# Patient Record
Sex: Male | Born: 1965 | Race: Black or African American | Hispanic: No | Marital: Single | Smoking: Never smoker
Health system: Southern US, Community
[De-identification: ages and names within clinical notes are randomized; demographics above are authoritative.]

## PROBLEM LIST (undated history)

## (undated) DIAGNOSIS — H548 Legal blindness, as defined in USA: Secondary | ICD-10-CM

## (undated) DIAGNOSIS — I10 Essential (primary) hypertension: Secondary | ICD-10-CM

## (undated) DIAGNOSIS — S069XAA Unspecified intracranial injury with loss of consciousness status unknown, initial encounter: Secondary | ICD-10-CM

## (undated) HISTORY — DX: Legal blindness, as defined in USA: H54.8

## (undated) HISTORY — PX: HERNIA REPAIR: SHX51

## (undated) HISTORY — DX: Unspecified intracranial injury with loss of consciousness status unknown, initial encounter: S06.9XAA

## (undated) HISTORY — PX: FOOT SURGERY: SHX648

## (undated) HISTORY — PX: WRIST SURGERY: SHX841

## (undated) HISTORY — PX: SHOULDER SURGERY: SHX246

---

## 2016-04-03 ENCOUNTER — Emergency Department
Admission: EM | Admit: 2016-04-03 | Discharge: 2016-04-03 | Disposition: A | Discharge status: Home / Self Care | Payer: Non-veteran care | Attending: Emergency Medicine | Admitting: Emergency Medicine

## 2016-04-03 ENCOUNTER — Encounter: Payer: Self-pay | Admitting: Emergency Medicine

## 2016-04-03 ENCOUNTER — Emergency Department: Payer: Non-veteran care

## 2016-04-03 DIAGNOSIS — K859 Acute pancreatitis without necrosis or infection, unspecified: Secondary | ICD-10-CM

## 2016-04-03 DIAGNOSIS — R778 Other specified abnormalities of plasma proteins: Secondary | ICD-10-CM

## 2016-04-03 DIAGNOSIS — R7989 Other specified abnormal findings of blood chemistry: Secondary | ICD-10-CM

## 2016-04-03 DIAGNOSIS — R1031 Right lower quadrant pain: Secondary | ICD-10-CM

## 2016-04-03 DIAGNOSIS — I10 Essential (primary) hypertension: Secondary | ICD-10-CM | POA: Insufficient documentation

## 2016-04-03 HISTORY — DX: Essential (primary) hypertension: I10

## 2016-04-03 LAB — CBC WITH DIFFERENTIAL/PLATELET
Basophils Absolute: 0 10*3/uL (ref 0–0.1)
Basophils Relative: 0 %
Eosinophils Absolute: 0.2 10*3/uL (ref 0–0.7)
Eosinophils Relative: 4 %
HEMATOCRIT: 39.6 % — AB (ref 40.0–52.0)
Hemoglobin: 14.1 g/dL (ref 13.0–18.0)
LYMPHS PCT: 53 %
Lymphs Abs: 3.2 10*3/uL (ref 1.0–3.6)
MCH: 29 pg (ref 26.0–34.0)
MCHC: 35.6 g/dL (ref 32.0–36.0)
MCV: 81.4 fL (ref 80.0–100.0)
Monocytes Absolute: 0.6 10*3/uL (ref 0.2–1.0)
Monocytes Relative: 10 %
NEUTROS ABS: 2 10*3/uL (ref 1.4–6.5)
NEUTROS PCT: 33 %
Platelets: 224 10*3/uL (ref 150–440)
RBC: 4.86 MIL/uL (ref 4.40–5.90)
RDW: 13.9 % (ref 11.5–14.5)
WBC: 6 10*3/uL (ref 3.8–10.6)

## 2016-04-03 LAB — BASIC METABOLIC PANEL
ANION GAP: 5 (ref 5–15)
BUN: 12 mg/dL (ref 6–20)
CALCIUM: 8.9 mg/dL (ref 8.9–10.3)
CO2: 28 mmol/L (ref 22–32)
Chloride: 105 mmol/L (ref 101–111)
Creatinine, Ser: 1.17 mg/dL (ref 0.61–1.24)
Glucose, Bld: 96 mg/dL (ref 65–99)
Potassium: 4 mmol/L (ref 3.5–5.1)
Sodium: 138 mmol/L (ref 135–145)

## 2016-04-03 LAB — URINALYSIS, COMPLETE (UACMP) WITH MICROSCOPIC
BACTERIA UA: NONE SEEN
BILIRUBIN URINE: NEGATIVE
Glucose, UA: NEGATIVE mg/dL
Hgb urine dipstick: NEGATIVE
KETONES UR: NEGATIVE mg/dL
LEUKOCYTES UA: NEGATIVE
NITRITE: NEGATIVE
Protein, ur: NEGATIVE mg/dL
RBC / HPF: NONE SEEN RBC/hpf (ref 0–5)
SPECIFIC GRAVITY, URINE: 1.008 (ref 1.005–1.030)
SQUAMOUS EPITHELIAL / LPF: NONE SEEN
WBC, UA: NONE SEEN WBC/hpf (ref 0–5)
pH: 6 (ref 5.0–8.0)

## 2016-04-03 LAB — HEPATIC FUNCTION PANEL
ALBUMIN: 4 g/dL (ref 3.5–5.0)
ALT: 55 U/L (ref 17–63)
AST: 72 U/L — AB (ref 15–41)
Alkaline Phosphatase: 44 U/L (ref 38–126)
Bilirubin, Direct: <0.1 mg/dL — ABNORMAL LOW (ref 0.1–0.5)
TOTAL PROTEIN: 7.2 g/dL (ref 6.5–8.1)
Total Bilirubin: 0.7 mg/dL (ref 0.3–1.2)

## 2016-04-03 LAB — LIPASE, BLOOD: Lipase: 101 U/L — ABNORMAL HIGH (ref 11–51)

## 2016-04-03 LAB — TROPONIN I
TROPONIN I: 0.07 ng/mL — AB (ref <0.03)
Troponin I: 0.07 ng/mL (ref <0.03)

## 2016-04-03 LAB — LACTIC ACID, PLASMA: LACTIC ACID, VENOUS: 0.8 mmol/L (ref 0.5–1.9)

## 2016-04-03 LAB — C-REACTIVE PROTEIN

## 2016-04-03 MED ORDER — DEXTROSE 5 % IV SOLN: 1.0000 g | Freq: Once | INTRAVENOUS | Status: DC

## 2016-04-03 MED ORDER — OXYCODONE-ACETAMINOPHEN 5-325 MG PO TABS
2.0000 | ORAL_TABLET | Freq: Once | ORAL | Status: AC
  Administered 2016-04-03: 2 via ORAL
  Filled 2016-04-03: qty 2

## 2016-04-03 MED ORDER — ONDANSETRON HCL 4 MG/2ML IJ SOLN
4.0000 mg | Freq: Once | INTRAMUSCULAR | Status: AC
  Administered 2016-04-03: 4 mg via INTRAVENOUS
  Filled 2016-04-03: qty 2

## 2016-04-03 MED ORDER — SODIUM CHLORIDE 0.9 % IV BOLUS (SEPSIS)
1000.0000 mL | Freq: Once | INTRAVENOUS | Status: AC
  Administered 2016-04-03: 1000 mL via INTRAVENOUS

## 2016-04-03 MED ORDER — METRONIDAZOLE IN NACL 5-0.79 MG/ML-% IV SOLN
500.0000 mg | Freq: Once | INTRAVENOUS | Status: AC
  Administered 2016-04-03: 500 mg via INTRAVENOUS
  Filled 2016-04-03: qty 100

## 2016-04-03 MED ORDER — KETOROLAC TROMETHAMINE 30 MG/ML IJ SOLN
15.0000 mg | Freq: Once | INTRAMUSCULAR | Status: AC
  Administered 2016-04-03: 15 mg via INTRAVENOUS
  Filled 2016-04-03: qty 1

## 2016-04-03 MED ORDER — IOPAMIDOL (ISOVUE-300) INJECTION 61%
125.0000 mL | Freq: Once | INTRAVENOUS | Status: AC | PRN
  Administered 2016-04-03: 125 mL via INTRAVENOUS

## 2016-04-03 MED ORDER — OXYCODONE-ACETAMINOPHEN 5-325 MG PO TABS: 1.0000 | ORAL_TABLET | Freq: Four times a day (QID) | ORAL | 0 refills | Status: DC | PRN

## 2016-04-03 MED ORDER — MORPHINE SULFATE (PF) 4 MG/ML IV SOLN
4.0000 mg | Freq: Once | INTRAVENOUS | Status: AC
Start: 2016-04-03 — End: 2016-04-03
  Administered 2016-04-03: 4 mg via INTRAVENOUS
  Filled 2016-04-03: qty 1

## 2016-04-03 MED ORDER — MORPHINE SULFATE (PF) 4 MG/ML IV SOLN
8.0000 mg | Freq: Once | INTRAVENOUS | Status: AC
  Administered 2016-04-03: 8 mg via INTRAVENOUS
  Filled 2016-04-03: qty 2

## 2016-04-03 MED ORDER — IOPAMIDOL (ISOVUE-300) INJECTION 61%
30.0000 mL | Freq: Once | INTRAVENOUS | Status: AC
  Administered 2016-04-03: 30 mL via ORAL

## 2016-04-03 MED ORDER — CEFTRIAXONE SODIUM-DEXTROSE 1-3.74 GM-% IV SOLR
1.0000 g | Freq: Once | INTRAVENOUS | Status: AC
  Administered 2016-04-03: 1 g via INTRAVENOUS
  Filled 2016-04-03: qty 50

## 2016-04-03 NOTE — Discharge Instructions (Addendum)
Please follow-up with your primary care physician on Monday for recheck. Return to the emergency department sooner for any new or worsening symptoms such as if you cannot eat or drink, for fevers or chills, or for any other concerns.

## 2016-04-03 NOTE — ED Triage Notes (Signed)
Pt presents to ED 15 via EMS c/o abdominal pain in RLQ x4 days; per EMS pt's vitals were BP 169/92, HR 76, RR 20; pt has history of HTN and takes Lisnopril and metaprolol. Pt states allergy to Iodine.

## 2016-04-03 NOTE — ED Provider Notes (Signed)
Hand off from Dr.Rifenbark in this 51 year old male with right lower quadrant pain and nausea over the past week. CAT scan was not concerning for appendicitis. However, the patient did have a slightly elevated troponin at 0.07. The plan is to recheck a second troponin and reevaluated and likely discharge if the troponin is stable or decreasing.  Physical Exam  BP 136/83 (BP Location: Right Arm)   Pulse 76   Temp 97.8 F (36.6 C) (Oral)   Resp 18   Ht 6\' 6"  (1.981 m)   Wt 300 lb (136.1 kg)   SpO2 96%   BMI 34.67 kg/m  ----------------------------------------- 9:53 AM on 04/03/2016 -----------------------------------------   Physical Exam Patient without any acute distress. Palpable the abdomen and there is still mild to moderate tenderness across the lower abdomen without any rebound or guarding. ED Course  Procedures  MDM Patient denies any chest pain or shortness of breath. I discussed the case with surgeon, Dr. Aleen CampiPiscoya, who reviewed the CAT scan and agrees with radiology read. The patient was also found to have a fairly large amount of stool throughout his colon. I discussed with the patient and he says that he has had issues with constipation the past. He says that he would like to try over-the-counter laxatives at home such as MiraLAX, Colace or magnesium citrate. He was also found to have a slightly elevated lipase at 100 but he is able to tolerate by mouth. We discussed using a simple diet treated pancreatitis. The patient denies any excessive alcohol intake. His troponin was also found to be unchanged at 0.07 after the second draw. He will be discharged home at this time. [facility] He will also be given cardiology follow-up.       Myrna Blazeravid Matthew Schaevitz, MD 04/03/16 [phone]

## 2016-04-03 NOTE — ED Provider Notes (Signed)
[facility] Emergency Department Provider Note  ____________________________________________   First MD Initiated Contact with Patient 04/03/16 [phone]     (approximate)  I have reviewed the triage vital signs and the nursing notes.   HISTORY  Chief Complaint Abdominal Pain   HPI Juan Ramos is a 51 y.o. male who comes to the emergency department with severe aching cramping right lower quadrant pain for the past 3 days. Pain initially began more diffuse but has progressively worsened and become right lower quadrant. He does have a history of right inguinal hernia repair. He denies fevers or chills. He denies chest pain or shortness of breath. He reports nausea but denies vomiting. He denies diarrhea. He is not constipated. Is passing normal flatus. He feels achy cannot find a position of comfort.Nothing makes the pain better or worse. Pain does not radiate.   Past Medical History:  Diagnosis Date  . Hypertension     There are no active problems to display for this patient.   Past Surgical History:  Procedure Laterality Date  . HERNIA REPAIR    . SHOULDER SURGERY      Prior to Admission medications   Not on File    Allergies Shellfish allergy  History reviewed. No pertinent family history.  Social History Social History  Substance Use Topics  . Smoking status: Never Smoker  . Smokeless tobacco: Never Used  . Alcohol use No    Review of Systems Constitutional: No fever/chills Eyes: No visual changes. ENT: No sore throat. Cardiovascular: Denies chest pain. Respiratory: Denies shortness of breath. Gastrointestinal: Positive abdominal pain.  Positive nausea, no vomiting.  No diarrhea.  No constipation. Genitourinary: Negative for dysuria. Musculoskeletal: Negative for back pain. Skin: Negative for rash. Neurological: Negative for headaches, focal weakness or numbness.  10-point ROS otherwise  negative.  ____________________________________________   PHYSICAL EXAM:  VITAL SIGNS: ED Triage Vitals  Enc Vitals Group     BP      Pulse      Resp      Temp      Temp src      SpO2      Weight      Height      Head Circumference      Peak Flow      Pain Score      Pain Loc      Pain Edu?      Excl. in GC?     Constitutional: Alert and oriented x 4 Appears extremely uncomfortable moaning in bed Eyes: PERRL EOMI. Head: Atraumatic. Nose: No congestion/rhinnorhea. Mouth/Throat: No trismus Neck: No stridor.   Cardiovascular: Normal rate, regular rhythm. Grossly normal heart sounds.  Good peripheral circulation. Respiratory: Normal respiratory effort.  No retractions. Lungs CTAB and moving good air Gastrointestinal: Soft nondistended very tender over right lower quadrant over McBurney's point with a positive Rovsing's voluntary guarding no rebound no frank peritonitis Normal hernia exam on the right and left Musculoskeletal: No lower extremity edema   Neurologic:  Normal speech and language. No gross focal neurologic deficits are appreciated. Skin:  Skin is warm, dry and intact. No rash noted. Psychiatric: Mood and affect are normal. Speech and behavior are normal.    ____________________________________________   DIFFERENTIAL  Appendicitis, diverticulitis, enteritis, inguinal hernia, lower lobe pneumonia ____________________________________________   LABS (all labs ordered are listed, but only abnormal results are displayed)  Labs Reviewed  HEPATIC FUNCTION PANEL - Abnormal; Notable for the following:  Result Value   AST 72 (*)    Bilirubin, Direct <0.1 (*)    All other components within normal limits  TROPONIN I - Abnormal; Notable for the following:    Troponin I 0.07 (*)    All other components within normal limits  CBC WITH DIFFERENTIAL/PLATELET - Abnormal; Notable for the following:    HCT 39.6 (*)    All other components within normal limits   URINALYSIS, COMPLETE (UACMP) WITH MICROSCOPIC - Abnormal; Notable for the following:    Color, Urine STRAW (*)    APPearance CLEAR (*)    All other components within normal limits  CULTURE, BLOOD (ROUTINE X 2)  CULTURE, BLOOD (ROUTINE X 2)  BASIC METABOLIC PANEL  LACTIC ACID, PLASMA  C-REACTIVE PROTEIN  TROPONIN I    Slightly elevated troponin __________________________________________  EKG   ____________________________________________  RADIOLOGY  CT scan is negative for acute pathology ____________________________________________   PROCEDURES  Procedure(s) performed: no  Procedures  Critical Care performed: no  ____________________________________________   INITIAL IMPRESSION / ASSESSMENT AND PLAN / ED COURSE  Pertinent labs & imaging results that were available during my care of the patient were reviewed by me and considered in my medical decision making (see chart for details).  On arrival the patient is uncomfortable appearing with right lower quadrant tenderness and a positive Rovsing's which are consistent with appendicitis. He has no palpable hernia. Nothing by mouth fluids ceftriaxone and Flagyl pending CT scan.      ----------------------------------------- 7:30 AM on 04/03/2016 -----------------------------------------  The patient's CT scan is negative for appendicitis and negative for acute pathology. His hernia is well reduced. No clear etiology of his symptoms. The only lab abnormalities a slight elevation in his troponin which may be secondary to pain and manage ischemia. I'll check a repeat now but if it is normal the patient will be stable for discharge home. ____________________________________________   FINAL CLINICAL IMPRESSION(S) / ED DIAGNOSES  Final diagnoses:  Elevated troponin  Right lower quadrant abdominal pain      NEW MEDICATIONS STARTED DURING THIS VISIT:  New Prescriptions   No medications on file     Note:  This  document was prepared using Dragon voice recognition software and may include unintentional dictation errors.     Merrily Brittle, MD 04/03/16 [phone]

## 2016-04-05 ENCOUNTER — Telehealth: Payer: Self-pay

## 2016-04-05 NOTE — Telephone Encounter (Signed)
Lmov for patient to call back  °He was seen in ED fu for Abdominal pain  on 04/03/16   °Will try again at a later time.  °

## 2016-04-06 NOTE — Telephone Encounter (Signed)
Lmov for patient to call back  He was seen in ED fu for Abdominal pain  on 04/03/16  Will try again at a later time.

## 2016-04-08 LAB — CULTURE, BLOOD (ROUTINE X 2)
CULTURE: NO GROWTH
Culture: NO GROWTH

## 2016-04-12 NOTE — Telephone Encounter (Signed)
Sent letter

## 2016-04-28 ENCOUNTER — Ambulatory Visit: Payer: No Typology Code available for payment source | Admitting: Internal Medicine

## 2016-05-04 ENCOUNTER — Ambulatory Visit: Payer: No Typology Code available for payment source | Admitting: Internal Medicine

## 2017-03-24 DIAGNOSIS — M5431 Sciatica, right side: Secondary | ICD-10-CM | POA: Insufficient documentation

## 2017-03-24 DIAGNOSIS — IMO0002 Reserved for concepts with insufficient information to code with codable children: Secondary | ICD-10-CM | POA: Insufficient documentation

## 2017-03-24 DIAGNOSIS — G43709 Chronic migraine without aura, not intractable, without status migrainosus: Secondary | ICD-10-CM | POA: Insufficient documentation

## 2017-04-06 DIAGNOSIS — M25512 Pain in left shoulder: Secondary | ICD-10-CM | POA: Insufficient documentation

## 2017-04-06 DIAGNOSIS — G8929 Other chronic pain: Secondary | ICD-10-CM | POA: Insufficient documentation

## 2017-10-13 DIAGNOSIS — G8929 Other chronic pain: Secondary | ICD-10-CM | POA: Insufficient documentation

## 2017-12-10 ENCOUNTER — Emergency Department
Admission: EM | Admit: 2017-12-10 | Discharge: 2017-12-10 | Disposition: A | Discharge status: Home / Self Care | Payer: Non-veteran care | Attending: Emergency Medicine | Admitting: Emergency Medicine

## 2017-12-10 ENCOUNTER — Other Ambulatory Visit: Payer: Self-pay

## 2017-12-10 ENCOUNTER — Emergency Department: Payer: Non-veteran care

## 2017-12-10 DIAGNOSIS — I10 Essential (primary) hypertension: Secondary | ICD-10-CM | POA: Insufficient documentation

## 2017-12-10 DIAGNOSIS — G43809 Other migraine, not intractable, without status migrainosus: Secondary | ICD-10-CM | POA: Diagnosis not present

## 2017-12-10 DIAGNOSIS — R079 Chest pain, unspecified: Secondary | ICD-10-CM | POA: Insufficient documentation

## 2017-12-10 DIAGNOSIS — Z79899 Other long term (current) drug therapy: Secondary | ICD-10-CM | POA: Diagnosis not present

## 2017-12-10 LAB — URINE DRUG SCREEN, QUALITATIVE (ARMC ONLY)
AMPHETAMINES, UR SCREEN: NOT DETECTED
Barbiturates, Ur Screen: NOT DETECTED
Benzodiazepine, Ur Scrn: NOT DETECTED
Cannabinoid 50 Ng, Ur ~~LOC~~: NOT DETECTED
Cocaine Metabolite,Ur ~~LOC~~: NOT DETECTED
MDMA (ECSTASY) UR SCREEN: NOT DETECTED
Methadone Scn, Ur: NOT DETECTED
Opiate, Ur Screen: POSITIVE — AB
Phencyclidine (PCP) Ur S: NOT DETECTED
Tricyclic, Ur Screen: NOT DETECTED

## 2017-12-10 LAB — CBC WITH DIFFERENTIAL/PLATELET
Abs Immature Granulocytes: 0.01 10*3/uL (ref 0.00–0.07)
BASOS PCT: 0 %
Basophils Absolute: 0 10*3/uL (ref 0.0–0.1)
EOS PCT: 3 %
Eosinophils Absolute: 0.1 10*3/uL (ref 0.0–0.5)
HCT: 42 % (ref 39.0–52.0)
HEMOGLOBIN: 14.8 g/dL (ref 13.0–17.0)
Immature Granulocytes: 0 %
Lymphocytes Relative: 52 %
Lymphs Abs: 2.5 10*3/uL (ref 0.7–4.0)
MCH: 28.4 pg (ref 26.0–34.0)
MCHC: 35.2 g/dL (ref 30.0–36.0)
MCV: 80.5 fL (ref 80.0–100.0)
MONO ABS: 0.4 10*3/uL (ref 0.1–1.0)
MONOS PCT: 8 %
Neutro Abs: 1.8 10*3/uL (ref 1.7–7.7)
Neutrophils Relative %: 37 %
Platelets: 215 10*3/uL (ref 150–400)
RBC: 5.22 MIL/uL (ref 4.22–5.81)
RDW: 13.7 % (ref 11.5–15.5)
WBC: 4.8 10*3/uL (ref 4.0–10.5)
nRBC: 0 % (ref 0.0–0.2)

## 2017-12-10 LAB — COMPREHENSIVE METABOLIC PANEL
ALBUMIN: 4 g/dL (ref 3.5–5.0)
ALK PHOS: 38 U/L (ref 38–126)
ALT: 21 U/L (ref 0–44)
ANION GAP: 6 (ref 5–15)
AST: 26 U/L (ref 15–41)
BUN: 12 mg/dL (ref 6–20)
CO2: 28 mmol/L (ref 22–32)
Calcium: 8.7 mg/dL — ABNORMAL LOW (ref 8.9–10.3)
Chloride: 106 mmol/L (ref 98–111)
Creatinine, Ser: 1.26 mg/dL — ABNORMAL HIGH (ref 0.61–1.24)
GFR calc Af Amer: >60 mL/min (ref >60)
GFR calc non Af Amer: >60 mL/min (ref >60)
GLUCOSE: 97 mg/dL (ref 70–99)
POTASSIUM: 3.8 mmol/L (ref 3.5–5.1)
Sodium: 140 mmol/L (ref 135–145)
Total Bilirubin: 0.5 mg/dL (ref 0.3–1.2)
Total Protein: 7.3 g/dL (ref 6.5–8.1)

## 2017-12-10 LAB — TROPONIN I
TROPONIN I: 0.07 ng/mL — AB (ref <0.03)
TROPONIN I: 0.06 ng/mL — AB (ref <0.03)

## 2017-12-10 LAB — ETHANOL: Alcohol, Ethyl (B): <10 mg/dL (ref <10)

## 2017-12-10 LAB — LIPASE, BLOOD: Lipase: 41 U/L (ref 11–51)

## 2017-12-10 MED ORDER — DIPHENHYDRAMINE HCL 50 MG/ML IJ SOLN
25.0000 mg | Freq: Once | INTRAMUSCULAR | Status: AC
  Administered 2017-12-10: 25 mg via INTRAVENOUS
  Filled 2017-12-10: qty 1

## 2017-12-10 MED ORDER — PROCHLORPERAZINE EDISYLATE 10 MG/2ML IJ SOLN
5.0000 mg | Freq: Once | INTRAMUSCULAR | Status: AC
  Administered 2017-12-10: 5 mg via INTRAVENOUS
  Filled 2017-12-10: qty 2

## 2017-12-10 MED ORDER — DEXTROSE-NACL 5-0.45 % IV SOLN
INTRAVENOUS | Status: DC
  Administered 2017-12-10: 09:00:00 via INTRAVENOUS

## 2017-12-10 MED ORDER — ONDANSETRON HCL 4 MG/2ML IJ SOLN
4.0000 mg | Freq: Once | INTRAMUSCULAR | Status: AC
  Administered 2017-12-10: 4 mg via INTRAVENOUS
  Filled 2017-12-10: qty 2

## 2017-12-10 MED ORDER — MORPHINE SULFATE (PF) 4 MG/ML IV SOLN
4.0000 mg | Freq: Once | INTRAVENOUS | Status: AC
  Administered 2017-12-10: 4 mg via INTRAVENOUS
  Filled 2017-12-10: qty 1

## 2017-12-10 MED ORDER — FAMOTIDINE IN NACL 20-0.9 MG/50ML-% IV SOLN
20.0000 mg | Freq: Once | INTRAVENOUS | Status: AC
  Administered 2017-12-10: 20 mg via INTRAVENOUS
  Filled 2017-12-10: qty 50

## 2017-12-10 NOTE — Discharge Instructions (Signed)
Continue all medicines as directed by your doctor. Return to the ER for worsening symptoms, persistent vomiting, difficulty breathing or other concerns. °

## 2017-12-10 NOTE — ED Provider Notes (Signed)
[facility] Emergency Department Provider Note   ____________________________________________   First MD Initiated Contact with Patient 12/10/17 0406     (approximate)  I have reviewed the triage vital signs and the nursing notes.   HISTORY  Chief Complaint Chest pain   HPI Juan Ramos is a 52 y.o. male brought to the ED from home via EMS with a chief complaint of chest pain.  Patient states was still awake when he noted onset of left-sided chest pressure approximately 3 hours ago.  Symptoms associated with diaphoresis, shortness of breath and nausea.  Received full-strength aspirin and 3 sublingual nitroglycerin by EMS which brought his pain from 8/10 down to 4/10.  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  Denies associated fever, chills, cough, congestion, abdominal pain, vomiting, diarrhea.  Denies recent travel or trauma.   Past Medical History:  Diagnosis Date  . Hypertension     There are no active problems to display for this patient.   Past Surgical History:  Procedure Laterality Date  . HERNIA REPAIR    . SHOULDER SURGERY      Prior to Admission medications   Medication Sig Start Date End Date Taking? Authorizing Provider  oxybutynin (DITROPAN-XL) 10 MG 24 hr tablet Take by mouth. 11/15/17  Yes [provider]  Aspirin-Calcium Carbonate 81-777 MG TABS Take by mouth.    [provider]  atenolol (TENORMIN) 25 MG tablet Take by mouth.    [provider]  diclofenac sodium (VOLTAREN) 1 % GEL Place onto the skin.    [provider]  dicyclomine (BENTYL) 10 MG capsule Take by mouth.    [provider]  DULoxetine (CYMBALTA) 30 MG capsule Take by mouth.    [provider]  loratadine (CLARITIN) 10 MG tablet Take by mouth.    [provider]  naproxen (NAPROSYN) 500 MG tablet Take by mouth.    [provider]  oxyCODONE-acetaminophen (ROXICET)  5-325 MG tablet Take 1 tablet by mouth every 6 (six) hours as needed. 04/03/16   Merrily Brittleifenbark, Neil, MD  pantoprazole (PROTONIX) 40 MG tablet Take by mouth.    [provider]  polyethylene glycol (MIRALAX / GLYCOLAX) packet Take by mouth.    [provider]  sildenafil (VIAGRA) 100 MG tablet Take by mouth.    [provider]  SUMAtriptan (IMITREX) 100 MG tablet Take by mouth.    [provider]  SUMAtriptan 6 MG/0.5ML SOAJ Inject into the skin.    [provider]  topiramate (TOPAMAX) 25 MG capsule Take by mouth.    [provider]  venlafaxine (EFFEXOR) 75 MG tablet Take by mouth.    [provider]    Allergies Shellfish allergy  History reviewed. No pertinent family history.  Social History Social History   Tobacco Use  . Smoking status: Never Smoker  . Smokeless tobacco: Never Used  Substance Use Topics  . Alcohol use: No  . Drug use: No    Review of Systems  Constitutional: No fever/chills Eyes: No visual changes. ENT: No sore throat. Cardiovascular: Positive for chest pain. Respiratory: Denies shortness of breath. Gastrointestinal: No abdominal pain.  Positive for nausea, no vomiting.  No diarrhea.  No constipation. Genitourinary: Negative for dysuria. Musculoskeletal: Negative for back pain. Skin: Negative for rash. Neurological: Negative for headaches, focal weakness or numbness.   ____________________________________________   PHYSICAL EXAM:  VITAL SIGNS: ED Triage Vitals  Enc Vitals Group     BP  Pulse      Resp      Temp      Temp src      SpO2      Weight      Height      Head Circumference      Peak Flow      Pain Score      Pain Loc      Pain Edu?      Excl. in GC?     Constitutional: Alert and oriented. Well appearing and in mild acute distress. Eyes: Conjunctivae are normal. PERRL. EOMI. Wearing sunglasses. Head: Atraumatic. Nose: No congestion/rhinnorhea. Mouth/Throat:  Mucous membranes are moist.  Oropharynx non-erythematous. Neck: No stridor.   Cardiovascular: Normal rate, regular rhythm. Grossly normal heart sounds.  Good peripheral circulation. Respiratory: Normal respiratory effort.  No retractions. Lungs CTAB. Gastrointestinal: Soft and nontender. No distention. No abdominal bruits. No CVA tenderness. Musculoskeletal: No lower extremity tenderness nor edema.  No joint effusions. Neurologic:  Normal speech and language. No gross focal neurologic deficits are appreciated.  Skin:  Skin is warm, dry and intact. No rash noted. Psychiatric: Mood and affect are normal. Speech and behavior are normal.  ____________________________________________   LABS (all labs ordered are listed, but only abnormal results are displayed)  Labs Reviewed  COMPREHENSIVE METABOLIC PANEL - Abnormal; Notable for the following components:      Result Value   Creatinine, Ser 1.26 (*)    Calcium 8.7 (*)    All other components within normal limits  TROPONIN I - Abnormal; Notable for the following components:   Troponin I 0.07 (*)    All other components within normal limits  CBC WITH DIFFERENTIAL/PLATELET  ETHANOL  LIPASE, BLOOD  URINE DRUG SCREEN, QUALITATIVE ([facility] ONLY)  TROPONIN I   ____________________________________________  EKG  ED ECG REPORT I, Paz Winsett J, the attending physician, personally viewed and interpreted this ECG.   Date: 12/10/2017  EKG Time: 0411  Rate: 63  Rhythm: normal EKG, normal sinus rhythm  Axis: Normal  Intervals:none  ST&T Change: Nonspecific  ____________________________________________  RADIOLOGY  ED MD interpretation: No acute cardiopulmonary process  Official radiology report(s): Dg Chest Port 1 View  Result Date: 12/10/2017 CLINICAL DATA:  Chest pain EXAM: PORTABLE CHEST 1 VIEW COMPARISON:  None. FINDINGS: The heart size and mediastinal contours are within normal limits. Both lungs are clear. The visualized skeletal  structures are unremarkable. IMPRESSION: No active disease. Electronically Signed   By: Deatra Robinson M.D.   On: 12/10/2017 05:05    ____________________________________________   PROCEDURES  Procedure(s) performed: None  Procedures  Critical Care performed: None  ____________________________________________   INITIAL IMPRESSION / ASSESSMENT AND PLAN / ED COURSE  As part of my medical decision making, I reviewed the following data within the electronic MEDICAL RECORD NUMBER Nursing notes reviewed and incorporated, Labs reviewed, EKG interpreted, Old chart reviewed, Radiograph reviewed, Discussed with admitting physician and Notes from prior ED visits   53 year old male with a history of hypertension who presents with chest pain. Differential diagnosis includes, but is not limited to, ACS, aortic dissection, pulmonary embolism, cardiac tamponade, pneumothorax, pneumonia, pericarditis, myocarditis, GI-related causes including esophagitis/gastritis, and musculoskeletal chest wall pain.    Will check cardiac panel, administer 4 mg IV morphine for pain paired with 4 mg IV Zofran for nausea.  Will reassess.  Clinical Course as of Dec 11 718  Sat Dec 10, 2017  9147 Updated patient of all lab results.  Looks like he has chronically  elevated troponins.  He was seen at an outside hospital in September for similar chest pain.  States he had a follow-up with a cardiologist at the [facility] with normal stress test.  Given this information, will repeat timed troponin, add 20mg  IV Pepcid for discomfort. At this time patient is more concerned with his recurrent migraine headache than his chest pain. Will administer IV fluids, 5mg  IV Compazine paired with 25mg  IV Benadryl.   [JS]  0702 Patient sleeping in no acute distress.  Repeat troponin to be drawn shortly.  If it remains stable, anticipate patient may be discharged home.  At this time care is transferred to Dr. Roxan Hockey pending repeat troponin  and disposition.   [JS]    Clinical Course User Index [JS] Irean Hong, MD     ____________________________________________   FINAL CLINICAL IMPRESSION(S) / ED DIAGNOSES  Final diagnoses:  Chest pain, unspecified type  Other migraine without status migrainosus, not intractable     ED Discharge Orders    None       Note:  This document was prepared using Dragon voice recognition software and may include unintentional dictation errors.   Irean Hong, MD 12/10/17 [phone]

## 2017-12-10 NOTE — ED Provider Notes (Signed)
Patient received in sign-out from Dr. Dolores FrameSung.  Workup and evaluation pending repeat troponin.  Troponin is downtrending.  This point patient appears stable and appropriate for discharge home.Juan Ramos.      Juan Calderone, MD 12/10/17 [phone]

## 2017-12-10 NOTE — ED Triage Notes (Signed)
Pt arrived via [facility] from home with c/o Chest pain. EMS states pt started having chest pain while he was sitting at home. EMS states pt was given 324 mg aspirin and 3 nitro sprays. Pt states pain went from 8/10 to 4/10.

## 2017-12-10 NOTE — ED Notes (Signed)
Lab called to report a critical lab value: Troponin 0.07

## 2017-12-22 DIAGNOSIS — R3915 Urgency of urination: Secondary | ICD-10-CM | POA: Insufficient documentation

## 2017-12-22 DIAGNOSIS — N138 Other obstructive and reflux uropathy: Secondary | ICD-10-CM | POA: Insufficient documentation

## 2017-12-22 DIAGNOSIS — N401 Enlarged prostate with lower urinary tract symptoms: Secondary | ICD-10-CM | POA: Insufficient documentation

## 2018-07-13 ENCOUNTER — Ambulatory Visit: Payer: No Typology Code available for payment source | Admitting: Licensed Clinical Social Worker

## 2018-07-13 ENCOUNTER — Other Ambulatory Visit: Payer: Self-pay

## 2018-07-27 ENCOUNTER — Ambulatory Visit: Payer: No Typology Code available for payment source | Admitting: Licensed Clinical Social Worker

## 2018-07-27 ENCOUNTER — Other Ambulatory Visit: Payer: Self-pay

## 2018-07-27 DIAGNOSIS — M25551 Pain in right hip: Secondary | ICD-10-CM | POA: Insufficient documentation

## 2018-07-31 DIAGNOSIS — M25522 Pain in left elbow: Secondary | ICD-10-CM | POA: Insufficient documentation

## 2018-08-03 DIAGNOSIS — M7022 Olecranon bursitis, left elbow: Secondary | ICD-10-CM | POA: Insufficient documentation

## 2018-08-03 DIAGNOSIS — M542 Cervicalgia: Secondary | ICD-10-CM | POA: Insufficient documentation

## 2018-08-03 DIAGNOSIS — M19012 Primary osteoarthritis, left shoulder: Secondary | ICD-10-CM | POA: Insufficient documentation

## 2018-08-15 ENCOUNTER — Ambulatory Visit (INDEPENDENT_AMBULATORY_CARE_PROVIDER_SITE_OTHER): Payer: No Typology Code available for payment source | Admitting: Psychiatry

## 2018-08-15 ENCOUNTER — Other Ambulatory Visit: Payer: Self-pay

## 2018-08-15 ENCOUNTER — Encounter: Payer: Self-pay | Admitting: Psychiatry

## 2018-08-15 DIAGNOSIS — Z87820 Personal history of traumatic brain injury: Secondary | ICD-10-CM

## 2018-08-15 DIAGNOSIS — F4310 Post-traumatic stress disorder, unspecified: Secondary | ICD-10-CM

## 2018-08-15 DIAGNOSIS — F09 Unspecified mental disorder due to known physiological condition: Secondary | ICD-10-CM

## 2018-08-15 DIAGNOSIS — F319 Bipolar disorder, unspecified: Secondary | ICD-10-CM

## 2018-08-15 NOTE — Progress Notes (Signed)
Virtual Visit via Video Note  I connected with Juan Ramos on 08/15/18 at  1:00 PM EDT by a video enabled telemedicine application and verified that I am speaking with the correct person using two identifiers.   I discussed the limitations of evaluation and management by telemedicine and the availability of in person appointments. The patient expressed understanding and agreed to proceed.   I discussed the assessment and treatment plan with the patient. The patient was provided an opportunity to ask questions and all were answered. The patient agreed with the plan and demonstrated an understanding of the instructions.   The patient was advised to call back or seek an in-person evaluation if the symptoms worsen or if the condition fails to improve as anticipated.    Psychiatric Initial Adult Assessment   Patient Identification: Juan Ramos MRN:  [id number] Date of Evaluation:  08/15/2018 Referral Source: Juan LogeSiddi Patel PA Chief Complaint:   Chief Complaint    Establish Care     Visit Diagnosis:    ICD-10-CM   1. PTSD (post-traumatic stress disorder)  F43.10   2. Bipolar I disorder (HCC)  F31.9    unspecified  3. Cognitive disorder  F09   4. History of traumatic brain injury  Z87.820     History of Present Illness:  Juan Ramos is a 53 year old African-American male, lives in [place], married, has a history of PTSD, mood disorder, cognitive disorder, history of traumatic brain injury, history of seizure disorder, history of migraine headaches, gastroesophageal reflux disease, hypertension, was evaluated by telemedicine today.  Patient appeared to be a limited historian.  Patient while doing the assessment was riding in his car.  The telemedicine evaluation was hence interrupted at times.  Patient's wife Juan Ramos, provided collateral information however could not participate much since she was the driver of the car.  She reported that she was taking him to his primary providers  appointment.  Patient reports that he was under the care of a psychiatrist- [facility].  Patient reports that the last time he saw his psychiatrist was a month ago.  When asked about why he does not want to continue with his psychiatrist he reports that he was asked by his psychiatrist to find someone else.  He did not elaborate on why.  Patient also could not give any information about his medications.  He could not give any information about his medication dosages.  He reported that he can go back and print out a list of medication and send it to writer at some point.  Discussed with patient that since he does not know any information about what is going on and cannot give even his medication list or dosages it will be difficult to come up with a treatment plan today.  Patient was agreeable.  Patient reports a history of being in [employer]  He reports that he was at [employer] [facility] [place] for 8 months in 2001.  Patient reports a history of head injury prior to that and also a knee injury during the time that he was [facility] [employer].  Patient reports he was already struggling with a lot of mood symptoms however everything got worse after being at [employer].  Patient reports he has witnessed a lot of trauma while in [employer].  He reports he struggles with hypervigilance nightmares, flashbacks, racing thoughts all the time.  He reports that he struggles with a lot of sleep issues.  He reports he struggles with perceptual disturbances.  He reports he hears voices which are muffled and also sees shadow people all the time.  He also feels like someone touches him all the time which happens on a regular basis.  He reports he is currently on a medication called aripiprazole, does not know the dosage.  He reports his medication does help him to some extent to cope with it.  Patient reports he does struggle with mood lability and irritability and has a lot of racing thoughts.  Patient  however denies any excessive spending or other manic or hypomanic episodes.  Patient reports however that he was told by a provider at some point that he may have bipolar disorder.  Patient does report panic attacks.  He reports he goes through shortness of breath, racing heart rate, feeling of impending doom and has to do relaxation techniques and deep breathing to calm himself down.  He also reports he is on a medication however does not remember the name.  He reports his panic attacks happen on a regular basis however he has learned to cope with it to some extent.  Patient does report a history of suicide attempts at least twice in the past, last one was sometime in 2007.  Patient however currently denies any suicidality or homicidality.  Patient reports he did have a TBI which was severe and currently struggles with significant cognitive issues.  Patient was oriented only to self and situation and could not give the date, could not give his address, could not give any information about his medications, could not give his date of birth-was able to say he was born on [birth year] but could not give the year.  Patient reports that his wife helps him with all of his activities of daily living, managing his finances and all of that.  Patient also reports he had some injury to his left side and cannot use his left upper extremities.  Patient reports he uses a walker and a wheelchair to ambulate.  Per collateral information from his wife-' patient with significant sleep issues, goes through mood swings, is irritable often and at times is seen as withdrawn, wants to sit in a dark room, does not want to participate.  She also reports that he has significant memory problems due to his head injury and hence needs a lot of attention and care.  She reported that she cares for him as though she cares for one of her children.  Associated Signs/Symptoms: Depression Symptoms:  depressed mood, insomnia, psychomotor  agitation, difficulty concentrating, anxiety, panic attacks, (Hypo) Manic Symptoms:  Distractibility, Hallucinations, Impulsivity, Irritable Mood, Labiality of Mood, Anxiety Symptoms:  Panic Symptoms, Psychotic Symptoms:  Hallucinations: Auditory Tactile Visual PTSD Symptoms: Had a traumatic exposure:  as summarized above Re-experiencing:  Flashbacks Intrusive Thoughts Nightmares Hypervigilance:  Yes Hyperarousal:  Difficulty Concentrating Emotional Numbness/Detachment Increased Startle Response Irritability/Anger Sleep Avoidance:  Decreased Interest/Participation Foreshortened Future  Past Psychiatric History: Patient reports multiple inpatient mental health admissions in the past.  Patient does not remember any of his medications however reports he will send the medication list to [employer] [facility] [employer].  [facility].  Patient reports at least 2 suicide attempts in the past-last 1 was in 2007.  He could not elaborate more than that.  Previous Psychotropic Medications: Yes Patient reports he is on Cymbalta, Effexor, aripiprazole  Substance Abuse History in the last 12 months:  No.  Consequences of Substance  Abuse: Negative  Past Medical History:  Past Medical History:  Diagnosis Date  . Hypertension     Past Surgical History:  Procedure Laterality Date  . HERNIA REPAIR    . SHOULDER SURGERY      Family Psychiatric History: Patient denies any history of mental health problems in his family.  Family History:  Family History  Problem Relation Age of Onset  . Mental illness Neg Hx     Social History:   Social History   Socioeconomic History  . Marital status: Single    Spouse name: Not on file  . Number of children: Not on file  . Years of education: Not on file  . Highest education level: Not on file  Occupational History  . Not on file  Social Needs  . Financial resource strain:  Not on file  . Food insecurity    Worry: Not on file    Inability: Not on file  . Transportation needs    Medical: Not on file    Non-medical: Not on file  Tobacco Use  . Smoking status: Never Smoker  . Smokeless tobacco: Never Used  Substance and Sexual Activity  . Alcohol use: No  . Drug use: No  . Sexual activity: Yes  Lifestyle  . Physical activity    Days per week: Not on file    Minutes per session: Not on file  . Stress: Not on file  Relationships  . Social Musicianconnections    Talks on phone: Not on file    Gets together: Not on file    Attends religious service: Not on file    Active member of club or organization: Not on file    Attends meetings of clubs or organizations: Not on file    Relationship status: Not on file  Other Topics Concern  . Not on file  Social History Narrative  . Not on file    Additional Social History: Patient is married twice, divorced once.  He currently lives with his wife-have been together since the past 14 years.  He has 7 children all together between the age of 53 to 53-year-old.  Patient lives in [place] with his wife and kids.  Patient used to be in [employer]s previously.  Patient reports he gets [facility].  Allergies:   Allergies  Allergen Reactions  . Iodine Shortness Of Breath and Hives    Pt states allergy to shellfish; pt states it makes him "break out and hard to breathe"  . Shellfish Allergy     Short of breath    Metabolic Disorder Labs: No results found for: HGBA1C, MPG No results found for: PROLACTIN No results found for: CHOL, TRIG, HDL, CHOLHDL, VLDL, LDLCALC No results found for: TSH  Therapeutic Level Labs: No results found for: LITHIUM No results found for: CBMZ No results found for: VALPROATE  Current Medications: Current Outpatient Medications  Medication Sig Dispense Refill  . Aspirin-Calcium Carbonate 81-777 MG TABS Take by mouth.    Marland Kitchen. atenolol (TENORMIN) 25 MG tablet Take by mouth.    .  diclofenac sodium (VOLTAREN) 1 % GEL Place onto the skin.    Marland Kitchen. dicyclomine (BENTYL) 10 MG capsule Take by mouth.    . DULoxetine (CYMBALTA) 30 MG capsule Take by mouth.    . loratadine (CLARITIN) 10 MG tablet Take by mouth.    . naproxen (NAPROSYN) 500 MG tablet Take by mouth.    . oxybutynin (DITROPAN-XL) 10 MG 24 hr tablet Take by  mouth.    . oxyCODONE-acetaminophen (ROXICET) 5-325 MG tablet Take 1 tablet by mouth every 6 (six) hours as needed. 11 tablet 0  . pantoprazole (PROTONIX) 40 MG tablet Take by mouth.    . polyethylene glycol (MIRALAX / GLYCOLAX) packet Take by mouth.    . sildenafil (VIAGRA) 100 MG tablet Take by mouth.    . SUMAtriptan (IMITREX) 100 MG tablet Take by mouth.    . SUMAtriptan 6 MG/0.5ML SOAJ Inject into the skin.    Marland Kitchen topiramate (TOPAMAX) 25 MG capsule Take by mouth.    . venlafaxine (EFFEXOR) 75 MG tablet Take by mouth.     No current facility-administered medications for this visit.     Musculoskeletal: Strength & Muscle Tone: UTA Gait & Station: Reports he uses walker, wheelchair Patient leans: N/A  Psychiatric Specialty Exam: Review of Systems  Psychiatric/Behavioral: Positive for depression, hallucinations and memory loss. The patient is nervous/anxious and has insomnia.   All other systems reviewed and are negative.   There were no vitals taken for this visit.There is no height or weight on file to calculate BMI.  General Appearance: Casual  Eye Contact:  wears dark glasses  Speech:  Clear and Coherent  Volume:  Normal  Mood:  Anxious  Affect:  Appropriate  Thought Process:  Goal Directed and Descriptions of Associations: Intact  Orientation:  Other:  oriented to self, situation  Thought Content:  Hallucinations: Auditory Tactile Visual  Suicidal Thoughts:  No  Homicidal Thoughts:  No  Memory:  Immediate;   Fair Recent;   Poor Remote;   limited  Judgement:  Fair  Insight:  Shallow  Psychomotor Activity:  Normal  Concentration:   Concentration: Fair and Attention Span: Fair  Recall:  [employer] of Knowledge:Fair  Language: Fair  Akathisia:  No  Handed:  Right  AIMS (if indicated): UTA  Assets:  Social Support Others:  access to health care  ADL's:  Intact  Cognition: Impaired,  Moderate to Severe  Sleep:  Poor   Screenings:   Assessment and Plan:Jrue is a 53 year old African-American male, married, on disability, lives in [place] with his wife, has a history of PTSD, mood disorder, cognitive disorder, history of TBI, seizure disorder, migraine headaches, hypertension, was evaluated by telemedicine today.  Patient is biologically predisposed given his history of trauma and multiple health issues.  He also has psychosocial stressors of having to cope with his chronic health problems.  Patient with significant cognitive issues was unable to participate much in the evaluation today.  Patient also could not give much information about his medications.  Collateral information was obtained from his wife as summarized above.  Plan as noted below.  Plan PTSD-unstable Patient will benefit from medication readjustment however since he does not remember any of his medications or dosages, advised him to request records from his previous psychiatrist.  Will mail consent form to him today. We will refer him for trauma focused therapy with therapist here in clinic.  For bipolar disorder unspecified-unstable Will not make any medication readjustment today.  For cognitive disorder-unstable We will request medical records from his neurologist.  Also he had neuropsychological testing done-request records.  Unable to decide what labs he may need today .  Will request records from his primary care provider.  Collateral information was obtained from wife as summarized above.  Discussed with patient to call clinic for a follow-up once he gets his medical records and medication list faxed to [place].  Patient agrees with  plan.  Crisis  plan discussed with wife advised to take him to the nearest emergency department if he has worsening symptoms or is a safety concern.  I have spent atleast 45 minutes non face to face with patient today. More than 50 % of the time was spent for psychoeducation and supportive psychotherapy and care coordination.  This note was generated in part or whole with voice recognition software. Voice recognition is usually quite accurate but there are transcription errors that can and very often do occur. I apologize for any typographical errors that were not detected and corrected.        Ursula Alert, MD 7/28/20205:33 PM

## 2018-09-06 ENCOUNTER — Ambulatory Visit (INDEPENDENT_AMBULATORY_CARE_PROVIDER_SITE_OTHER): Payer: No Typology Code available for payment source | Admitting: Psychiatry

## 2018-09-06 ENCOUNTER — Other Ambulatory Visit: Payer: Self-pay

## 2018-09-06 DIAGNOSIS — Z5329 Procedure and treatment not carried out because of patient's decision for other reasons: Secondary | ICD-10-CM

## 2018-09-06 NOTE — Progress Notes (Signed)
No response to call 

## 2018-09-14 ENCOUNTER — Other Ambulatory Visit: Payer: Self-pay

## 2018-09-14 ENCOUNTER — Emergency Department: Payer: No Typology Code available for payment source

## 2018-09-14 ENCOUNTER — Emergency Department
Admission: EM | Admit: 2018-09-14 | Discharge: 2018-09-14 | Disposition: A | Discharge status: Home / Self Care | Payer: No Typology Code available for payment source | Attending: Emergency Medicine | Admitting: Emergency Medicine

## 2018-09-14 DIAGNOSIS — Z87820 Personal history of traumatic brain injury: Secondary | ICD-10-CM | POA: Diagnosis not present

## 2018-09-14 DIAGNOSIS — R0789 Other chest pain: Secondary | ICD-10-CM | POA: Diagnosis present

## 2018-09-14 DIAGNOSIS — I10 Essential (primary) hypertension: Secondary | ICD-10-CM | POA: Diagnosis not present

## 2018-09-14 DIAGNOSIS — M940 Chondrocostal junction syndrome [Tietze]: Secondary | ICD-10-CM | POA: Insufficient documentation

## 2018-09-14 LAB — BASIC METABOLIC PANEL
Anion gap: 8 (ref 5–15)
BUN: 17 mg/dL (ref 6–20)
CO2: 29 mmol/L (ref 22–32)
Calcium: 9.1 mg/dL (ref 8.9–10.3)
Chloride: 103 mmol/L (ref 98–111)
Creatinine, Ser: 1.3 mg/dL — ABNORMAL HIGH (ref 0.61–1.24)
GFR calc Af Amer: >60 mL/min (ref >60)
GFR calc non Af Amer: >60 mL/min (ref >60)
Glucose, Bld: 105 mg/dL — ABNORMAL HIGH (ref 70–99)
Potassium: 3.2 mmol/L — ABNORMAL LOW (ref 3.5–5.1)
Sodium: 140 mmol/L (ref 135–145)

## 2018-09-14 LAB — CBC
HCT: 40.5 % (ref 39.0–52.0)
Hemoglobin: 14.6 g/dL (ref 13.0–17.0)
MCH: 28.5 pg (ref 26.0–34.0)
MCHC: 36 g/dL (ref 30.0–36.0)
MCV: 79.1 fL — ABNORMAL LOW (ref 80.0–100.0)
Platelets: 219 10*3/uL (ref 150–400)
RBC: 5.12 MIL/uL (ref 4.22–5.81)
RDW: 13.6 % (ref 11.5–15.5)
WBC: 6 10*3/uL (ref 4.0–10.5)
nRBC: 0 % (ref 0.0–0.2)

## 2018-09-14 LAB — TROPONIN I (HIGH SENSITIVITY): Troponin I (High Sensitivity): <2 ng/L (ref <18)

## 2018-09-14 MED ORDER — POTASSIUM CHLORIDE CRYS ER 20 MEQ PO TBCR
40.0000 meq | EXTENDED_RELEASE_TABLET | Freq: Once | ORAL | Status: AC
  Administered 2018-09-14: 40 meq via ORAL
  Filled 2018-09-14: qty 2

## 2018-09-14 MED ORDER — ASPIRIN 81 MG PO CHEW
324.0000 mg | CHEWABLE_TABLET | Freq: Once | ORAL | Status: AC
  Administered 2018-09-14: 06:00:00 324 mg via ORAL

## 2018-09-14 MED ORDER — ASPIRIN 81 MG PO CHEW
CHEWABLE_TABLET | ORAL | Status: AC
  Filled 2018-09-14: qty 4

## 2018-09-14 NOTE — ED Triage Notes (Signed)
Patient stated has had chest pain x2 days. Hx of hypertension. Patient denies any nausea and vomiting.

## 2018-09-14 NOTE — ED Provider Notes (Signed)
[facility] Emergency Department Provider Note   First MD Initiated Contact with Patient 09/14/18 [phone]     (approximate)  I have reviewed the triage vital signs and the nursing notes.   HISTORY  Chief Complaint Chest Pain   HPI Juan Ramos is a 53 y.o. male with below list of previous medical conditions presents to the emergency department with 3-day history of intermittent central chest pain that is nonradiating in nature.  Patient denies any dyspnea no nausea vomiting or diaphoresis.  Patient denies any lower extremity pain or swelling.  Patient denies any personal familial history of DVT PE or CAD.  Patient denies any cough no fever.  No nausea or vomiting.  Patient states that pain is worsened with palpation.        Past Medical History:  Diagnosis Date  . Hypertension     Patient Active Problem List   Diagnosis Date Noted  . PTSD (post-traumatic stress disorder) 08/15/2018  . Bipolar I disorder (HCC) 08/15/2018  . Cognitive disorder 08/15/2018  . History of traumatic brain injury 08/15/2018  . Neck pain 08/03/2018  . Olecranon bursitis of left elbow 08/03/2018  . Osteoarthritis of left shoulder 08/03/2018  . Left elbow pain 07/31/2018  . Right hip pain 07/27/2018  . 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 Urinary urgency 12/22/2017  . Chronic bilateral low back pain with bilateral sciatica 10/13/2017  . Chronic left shoulder pain 04/06/2017  . Bilateral sciatica 03/24/2017  . Chronic migraine 03/24/2017    Past Surgical History:  Procedure Laterality Date  . HERNIA REPAIR    . SHOULDER SURGERY      Prior to Admission medications   Medication Sig Start Date End Date Taking? Authorizing Provider  Aspirin-Calcium Carbonate 81-777 MG TABS Take by mouth.    [provider]  atenolol (TENORMIN) 25 MG tablet Take by mouth.    [provider]  diclofenac sodium (VOLTAREN) 1 % GEL Place onto the  skin.    [provider]  dicyclomine (BENTYL) 10 MG capsule Take by mouth.    [provider]  DULoxetine (CYMBALTA) 30 MG capsule Take by mouth.    [provider]  loratadine (CLARITIN) 10 MG tablet Take by mouth.    [provider]  naproxen (NAPROSYN) 500 MG tablet Take by mouth.    [provider]  oxybutynin (DITROPAN-XL) 10 MG 24 hr tablet Take by mouth. 11/15/17   [provider]  oxyCODONE-acetaminophen (ROXICET) 5-325 MG tablet Take 1 tablet by mouth every 6 (six) hours as needed. 04/03/16   Merrily Brittle, MD  pantoprazole (PROTONIX) 40 MG tablet Take by mouth.    [provider]  polyethylene glycol (MIRALAX / GLYCOLAX) packet Take by mouth.    [provider]  sildenafil (VIAGRA) 100 MG tablet Take by mouth.    [provider]  SUMAtriptan (IMITREX) 100 MG tablet Take by mouth.    [provider]  SUMAtriptan 6 MG/0.5ML SOAJ Inject into the skin.    [provider]  topiramate (TOPAMAX) 25 MG capsule Take by mouth.    [provider]  venlafaxine (EFFEXOR) 75 MG tablet Take by mouth.    [provider]    Allergies Iodine and Shellfish allergy  Family History  Problem Relation Age of Onset  . Mental illness Neg Hx     Social History Social History   Tobacco Use  . Smoking status: Never Smoker  . Smokeless tobacco:  Never Used  Substance Use Topics  . Alcohol use: No  . Drug use: No    Review of Systems Constitutional: No fever/chills Eyes: No visual changes. ENT: No sore throat. Cardiovascular: Positive for chest pain. Respiratory: Denies shortness of breath. Gastrointestinal: No abdominal pain.  No nausea, no vomiting.  No diarrhea.  No constipation. Genitourinary: Negative for dysuria. Musculoskeletal: Negative for neck pain.  Negative for back pain. Integumentary: Negative for rash. Neurological: Negative for headaches, focal weakness or  numbness.  ____________________________________________   PHYSICAL EXAM:  VITAL SIGNS: ED Triage Vitals  Enc Vitals Group     BP 09/14/18 0358 (!) 148/78     Pulse Rate 09/14/18 0358 78     Resp 09/14/18 0358 13     Temp 09/14/18 0358 98.2 F (36.8 C)     Temp Source 09/14/18 0358 Oral     SpO2 09/14/18 0358 99 %     Weight 09/14/18 0359 136.1 kg (300 lb)     Height 09/14/18 0359 1.981 m (6\' 6" )     Head Circumference --      Peak Flow --      Pain Score 09/14/18 0358 8     Pain Loc --      Pain Edu? --      Excl. in [place]? --     Constitutional: Alert and oriented.  Eyes: Conjunctivae are normal.  Head: Atraumatic. Mouth/Throat: Mucous membranes are moist. Neck: No stridor.  No meningeal signs.  Chest: Pain with palpation costosternal regions. Cardiovascular: Normal rate, regular rhythm. Good peripheral circulation. Grossly normal heart sounds. Respiratory: Normal respiratory effort.  No retractions. Gastrointestinal: Soft and nontender. No distention.   Musculoskeletal: No lower extremity tenderness nor edema. No gross deformities of extremities. Neurologic:  Normal speech and language. No gross focal neurologic deficits are appreciated.  Skin:  Skin is warm, dry and intact. Psychiatric: Mood and affect are normal. Speech and behavior are normal.  ____________________________________________   LABS (all labs ordered are listed, but only abnormal results are displayed)  Labs Reviewed  BASIC METABOLIC PANEL - Abnormal; Notable for the following components:      Result Value   Potassium 3.2 (*)    Glucose, Bld 105 (*)    Creatinine, Ser 1.30 (*)    All other components within normal limits  CBC - Abnormal; Notable for the following components:   MCV 79.1 (*)    All other components within normal limits  TROPONIN I (HIGH SENSITIVITY)  TROPONIN I (HIGH SENSITIVITY)   ____________________________________________  EKG  ED ECG REPORT I, [place] N , the  attending physician, personally viewed and interpreted this ECG.   Date: 09/14/2018  EKG Time: 3:59 AM  Rate: 76  Rhythm: Normal sinus rhythm  Axis: Normal  Intervals: Normal  ST&T Change: None  ____________________________________________  RADIOLOGY I, [place] N , personally viewed and evaluated these images (plain radiographs) as part of my medical decision making, as well as reviewing the written report by the radiologist.  ED MD interpretation: Negative chest x-ray per radiologist.  Official radiology report(s): Dg Chest Port 1 View  Result Date: 09/14/2018 CLINICAL DATA:  Chest pain EXAM: PORTABLE CHEST 1 VIEW COMPARISON:  12/10/2017 FINDINGS: Normal heart size and mediastinal contours. No acute infiltrate or edema. No effusion or pneumothorax. No acute osseous findings. IMPRESSION: Negative chest. Electronically Signed   By: Monte Fantasia M.D.   On: 09/14/2018 05:07    ____________________________________________  Procedures   ____________________________________________   INITIAL IMPRESSION /  MDM / ASSESSMENT AND PLAN / ED COURSE  As part of my medical decision making, I reviewed the following data within the electronic MEDICAL RECORD NUMBER   53 year old male presented with above-stated history and physical exam secondary to chest pain.  Consider to possibly CAD/MI/PE.  EKG revealed and no evidence of ischemia or infarction.  Laboratory data including high-sensitivity troponin negative.  Patient with no chest pain or shortness of breath at present will refer patient to cardiology for further outpatient evaluation.  ____________________________________________  FINAL CLINICAL IMPRESSION(S) / ED DIAGNOSES  Final diagnoses:  Chest wall pain  Costochondritis     MEDICATIONS GIVEN DURING THIS VISIT:  Medications  aspirin 81 MG chewable tablet (has no administration in time range)  potassium chloride SA (K-DUR) CR tablet 40 mEq (40 mEq Oral Given 09/14/18 0552)   aspirin chewable tablet 324 mg (324 mg Oral Given 09/14/18 0558)     ED Discharge Orders    None      *Please note:  Juan Ramos was evaluated in Emergency Department on 09/14/2018 for the symptoms described in the history of present illness. He was evaluated in the context of the global COVID-19 pandemic, which necessitated consideration that the patient might be at risk for infection with the SARS-CoV-2 virus that causes COVID-19. Institutional protocols and algorithms that pertain to the evaluation of patients at risk for COVID-19 are in a state of rapid change based on information released by regulatory bodies including the CDC and federal and state organizations. These policies and algorithms were followed during the patient's care in the ED.  Some ED evaluations and interventions may be delayed as a result of limited staffing during the pandemic.*  Note:  This document was prepared using Dragon voice recognition software and may include unintentional dictation errors.   Darci CurrentBrown, [place] N, MD 09/15/18 [phone]

## 2018-09-29 ENCOUNTER — Ambulatory Visit: Payer: No Typology Code available for payment source | Admitting: Licensed Clinical Social Worker

## 2018-10-04 ENCOUNTER — Ambulatory Visit (HOSPITAL_COMMUNITY): Payer: Self-pay | Admitting: Licensed Clinical Social Worker

## 2018-10-04 ENCOUNTER — Ambulatory Visit: Payer: Self-pay | Admitting: Licensed Clinical Social Worker

## 2018-10-17 ENCOUNTER — Ambulatory Visit: Payer: Self-pay | Admitting: Psychiatry

## 2018-10-17 ENCOUNTER — Encounter

## 2019-05-02 ENCOUNTER — Emergency Department: Payer: No Typology Code available for payment source

## 2019-05-02 ENCOUNTER — Emergency Department
Admission: EM | Admit: 2019-05-02 | Discharge: 2019-05-02 | Disposition: A | Discharge status: Home / Self Care | Payer: No Typology Code available for payment source | Attending: Student | Admitting: Student

## 2019-05-02 ENCOUNTER — Other Ambulatory Visit: Payer: Self-pay

## 2019-05-02 DIAGNOSIS — R0789 Other chest pain: Secondary | ICD-10-CM | POA: Diagnosis not present

## 2019-05-02 DIAGNOSIS — Z79899 Other long term (current) drug therapy: Secondary | ICD-10-CM | POA: Insufficient documentation

## 2019-05-02 DIAGNOSIS — R42 Dizziness and giddiness: Secondary | ICD-10-CM | POA: Diagnosis not present

## 2019-05-02 DIAGNOSIS — R079 Chest pain, unspecified: Secondary | ICD-10-CM

## 2019-05-02 DIAGNOSIS — I10 Essential (primary) hypertension: Secondary | ICD-10-CM | POA: Insufficient documentation

## 2019-05-02 LAB — BASIC METABOLIC PANEL
Anion gap: 9 (ref 5–15)
BUN: 14 mg/dL (ref 6–20)
CO2: 25 mmol/L (ref 22–32)
Calcium: 8.9 mg/dL (ref 8.9–10.3)
Chloride: 104 mmol/L (ref 98–111)
Creatinine, Ser: 1.29 mg/dL — ABNORMAL HIGH (ref 0.61–1.24)
GFR calc Af Amer: >60 mL/min (ref >60)
GFR calc non Af Amer: >60 mL/min (ref >60)
Glucose, Bld: 93 mg/dL (ref 70–99)
Potassium: 3.7 mmol/L (ref 3.5–5.1)
Sodium: 138 mmol/L (ref 135–145)

## 2019-05-02 LAB — CBC
HCT: 43 % (ref 39.0–52.0)
Hemoglobin: 15.2 g/dL (ref 13.0–17.0)
MCH: 28.5 pg (ref 26.0–34.0)
MCHC: 35.3 g/dL (ref 30.0–36.0)
MCV: 80.5 fL (ref 80.0–100.0)
Platelets: 228 10*3/uL (ref 150–400)
RBC: 5.34 MIL/uL (ref 4.22–5.81)
RDW: 13.7 % (ref 11.5–15.5)
WBC: 4.6 10*3/uL (ref 4.0–10.5)
nRBC: 0 % (ref 0.0–0.2)

## 2019-05-02 LAB — TROPONIN I (HIGH SENSITIVITY)
Troponin I (High Sensitivity): 3 ng/L (ref <18)
Troponin I (High Sensitivity): 3 ng/L (ref <18)

## 2019-05-02 MED ORDER — KETOROLAC TROMETHAMINE 30 MG/ML IJ SOLN: 15.0000 mg | Freq: Once | INTRAMUSCULAR | Status: DC

## 2019-05-02 MED ORDER — IOHEXOL 350 MG/ML SOLN
100.0000 mL | Freq: Once | INTRAVENOUS | Status: AC | PRN
  Administered 2019-05-02: 19:00:00 100 mL via INTRAVENOUS

## 2019-05-02 MED ORDER — KETOROLAC TROMETHAMINE 60 MG/2ML IM SOLN
30.0000 mg | Freq: Once | INTRAMUSCULAR | Status: AC
  Administered 2019-05-02: 20:00:00 30 mg via INTRAMUSCULAR
  Filled 2019-05-02: qty 2

## 2019-05-02 MED ORDER — OXYCODONE HCL 5 MG PO TABS
5.0000 mg | ORAL_TABLET | Freq: Once | ORAL | Status: AC
  Administered 2019-05-02: 20:00:00 5 mg via ORAL
  Filled 2019-05-02 (×2): qty 1

## 2019-05-02 MED ORDER — FENTANYL CITRATE (PF) 100 MCG/2ML IJ SOLN: 50.0000 ug | Freq: Once | INTRAMUSCULAR | Status: DC

## 2019-05-02 NOTE — ED Notes (Signed)
See triage note, pt reports bilateral CP and syncopal episodes since yesterday.  Reports hx seizures.  Denies N/V Pt in NAD at this time

## 2019-05-02 NOTE — ED Provider Notes (Signed)
[facility] Emergency Department Provider Note  ____________________________________________   First MD Initiated Contact with Patient 05/02/19 1831     (approximate)  I have reviewed the triage vital signs and the nursing notes.  History  Chief Complaint Chest Pain    HPI Juan Ramos is a 54 y.o. male with hx of HTN, PTSD, bipolar disorder who presents to the ER for chest discomfort, dizziness, passing out. Patient reports since yesterday he has had ongoing chest discomfort. Discomfort is located across his entire anterior chest. Describes it as a tingling sensation across bilateral chest area, moderate/severe. No radiation. No apparent alleviating or aggravating components.  He reports some associated SOB as well as dizziness. He states he passed out several times yesterday. A few of these were witnessed by family. States LOC was brief and upon awakening he was helped up by family, no confusion. Patient states he has a history of seizures, although unable to see this in his chart. He states he is on amitriptyline, gabapentin, and one other medication for this. Does not think family describe any seizure activity, but isn't positive. Denies any changes to his medications. No hx of VTE or PE.    Past Medical Hx Past Medical History:  Diagnosis Date  . Hypertension     Problem List Patient Active Problem List   Diagnosis Date Noted  . PTSD (post-traumatic stress disorder) 08/15/2018  . Bipolar I disorder ([place]) 08/15/2018  . Cognitive disorder 08/15/2018  . History of traumatic brain injury 08/15/2018  . Neck pain 08/03/2018  . Olecranon bursitis of left elbow 08/03/2018  . Osteoarthritis of left shoulder 08/03/2018  . Left elbow pain 07/31/2018  . Right hip pain 07/27/2018  . 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 Urinary urgency 12/22/2017  . Chronic bilateral low back pain with bilateral sciatica 10/13/2017  . Chronic left  shoulder pain 04/06/2017  . Bilateral sciatica 03/24/2017  . Chronic migraine 03/24/2017    Past Surgical Hx Past Surgical History:  Procedure Laterality Date  . HERNIA REPAIR    . SHOULDER SURGERY      Medications Prior to Admission medications   Medication Sig Start Date End Date Taking? Authorizing Provider  Aspirin-Calcium Carbonate 81-777 MG TABS Take by mouth.    [provider]  atenolol (TENORMIN) 25 MG tablet Take by mouth.    [provider]  diclofenac sodium (VOLTAREN) 1 % GEL Place onto the skin.    [provider]  dicyclomine (BENTYL) 10 MG capsule Take by mouth.    [provider]  DULoxetine (CYMBALTA) 30 MG capsule Take by mouth.    [provider]  loratadine (CLARITIN) 10 MG tablet Take by mouth.    [provider]  naproxen (NAPROSYN) 500 MG tablet Take by mouth.    [provider]  oxybutynin (DITROPAN-XL) 10 MG 24 hr tablet Take by mouth. 11/15/17   [provider]  oxyCODONE-acetaminophen (ROXICET) 5-325 MG tablet Take 1 tablet by mouth every 6 (six) hours as needed. 04/03/16   Darel Hong, MD  pantoprazole (PROTONIX) 40 MG tablet Take by mouth.    [provider]  polyethylene glycol (MIRALAX / GLYCOLAX) packet Take by mouth.    [provider]  sildenafil (VIAGRA) 100 MG tablet Take by mouth.    [provider]  SUMAtriptan (IMITREX) 100 MG tablet Take by mouth.    [provider]  SUMAtriptan 6 MG/0.5ML SOAJ Inject into the skin.  [provider]  topiramate (TOPAMAX) 25 MG capsule Take by mouth.    [provider]  venlafaxine (EFFEXOR) 75 MG tablet Take by mouth.    [provider]    Allergies Iodine and Shellfish allergy  Family Hx Family History  Problem Relation Age of Onset  . Mental illness Neg Hx     Social Hx Social History   Tobacco Use  . Smoking status: Never Smoker  . Smokeless tobacco: Never  Used  Substance Use Topics  . Alcohol use: No  . Drug use: No     Review of Systems  Constitutional: Negative for fever. Negative for chills. Eyes: Negative for visual changes. ENT: Negative for sore throat. Cardiovascular: + for chest pain. Respiratory: Negative for shortness of breath. Gastrointestinal: Negative for nausea. Negative for vomiting.  Genitourinary: Negative for dysuria. Musculoskeletal: Negative for leg swelling. Skin: Negative for rash. Neurological: Negative for headaches. + dizziness, passing out   Physical Exam  Vital Signs: ED Triage Vitals  Enc Vitals Group     BP 05/02/19 1400 (!) 166/111     Pulse Rate 05/02/19 1400 89     Resp 05/02/19 1400 18     Temp 05/02/19 1400 98.4 F (36.9 C)     Temp Source 05/02/19 1400 Oral     SpO2 05/02/19 1400 99 %     Weight 05/02/19 1401 (!) 304 lb (137.9 kg)     Height 05/02/19 1401 6\' 6"  (1.981 m)     Head Circumference --      Peak Flow --      Pain Score 05/02/19 1401 9     Pain Loc --      Pain Edu? --      Excl. in GC? --     Constitutional: Alert and oriented. Sitting in chair. NAD.  Head: Normocephalic. Atraumatic. Eyes: Does not take sunglasses off during exam/ED stay.  Nose: No masses or lesions. No congestion or rhinorrhea. Mouth/Throat: Wearing mask.  Neck: No stridor. Trachea midline.  Cardiovascular: Normal rate, regular rhythm. Extremities well perfused. Respiratory: Normal respiratory effort.  Lungs CTAB. Gastrointestinal: Soft. Non-distended. Non-tender.  Genitourinary: Deferred. Musculoskeletal: No lower extremity edema or asymmetry. No deformities. Neurologic:  Normal speech and language. No gross focal or lateralizing neurologic deficits are appreciated.  Skin: Skin is warm, dry and intact. No rash noted. Psychiatric: Patient is very frustrated about the wait time. Does not take his sunglasses off or take out his headphones throughout the entirety of his ER stay. On discharge, insists  on paper copies of his results prior to leaving the ER, despite discussing access through MyChart or medical records.   EKG  Personally reviewed and interpreted by myself.   Date: 05/02/19 Time: 1355 Rate: 80 Rhythm: sinus Axis: normal Intervals: WNL No acute ischemic changes No evidence of Brugada, WPW, or prolonged QTC No STEMI    Radiology  Personally reviewed available imaging myself.   CXR IMPRESSION:  No active cardiopulmonary disease.   CT PE  IMPRESSION:  No evidence of pulmonary embolism although the lower lobe branches  are somewhat incompletely opacified due to timing of the contrast bolus.  No other focal abnormality is noted.    Procedures  Procedure(s) performed (including critical care):  Procedures   Initial Impression / Assessment and Plan / MDM / ED Course  55 y.o. male who presents to the ED for chest discomfort (tingling), dizziness, and syncope, as above.   Ddx: ACS, pleurisy, costochondritis, PE, electrolyte abnormality,  arrhythmia. Patient states he has a seizure history, though unable to see this in his chart. Based on history, episodes do not sound seizure in etiology, but would remain on the differential. He denies any recent changes to his medications. Will check electrolytes, EKG. Advised he follow up as an outpatient with the provider who manages this.   Will plan for labs, EKG, imaging  EKG w/o acute ischemic or e/o arrhythmia. Troponin x 2 negative. Electrolytes w/o actionable derangements. CT negative for PE. Given negative work up, feel patient is stable for discharge w/ outpatient follow up. Advised PCP +/- cardiology follow up, discussion for potential Holter monitor. Supportive care, given return precautions.    _______________________________   As part of my medical decision making I have reviewed available labs, radiology tests, reviewed old records/chart review.    Final Clinical Impression(s) / ED Diagnosis  Final  diagnoses:  Chest pain in adult  Dizziness       Note:  This document was prepared using Dragon voice recognition software and may include unintentional dictation errors.   Miguel Aschoff., MD 05/02/19 2239

## 2019-05-02 NOTE — ED Triage Notes (Signed)
First Nurse Note:  C/O sob and dizziness.  Patient is AAOx3.   Skin warm and dry.  NAD

## 2019-05-02 NOTE — ED Notes (Signed)
Pt signed paper copy of discharge instructions, denies further questions concerns at this time

## 2019-05-02 NOTE — Discharge Instructions (Addendum)
Thank you for letting us take care of you in the emergency department today.   Please continue to take any regular, prescribed medications.   New medications we have prescribed:  High dose ibuprofen - anti-inflammatory/anti-pain medication, take as directed, as needed for pain  Please follow up with: Your primary care doctor to review your ER visit and follow up on your symptoms. Discuss Holter monitoring with your doctor.  Cardiology doctor - referral below The doctor that follows your seizures.  Please return to the ER for any new or worsening symptoms.

## 2019-05-02 NOTE — ED Notes (Signed)
Pt got called from triage at 1620 to get a repeat set of vs and 2nd troponin. No response.  Pt was in his private vehicle with a family member. Pt was told that we preferred him to be inside the building. Pt responded, "I am not staying inside the building, there is too many people, and I have anxiety." First nurse aware. Pt stayed outside the building.

## 2019-05-02 NOTE — ED Triage Notes (Signed)
Pt comes POV with chest pain, "blacking out" episodes since last night. Pt states he had 3 of them last night. Chest pain is bilateral and tingling. Pt c/o dizziness.

## 2020-06-08 IMAGING — DX PORTABLE CHEST - 1 VIEW
1 series · 1 of 1 positions shown · non-contrast
Comparison: 12/10/2017

CLINICAL DATA: Chest pain

EXAM:
PORTABLE CHEST 1 VIEW

[chest ap]
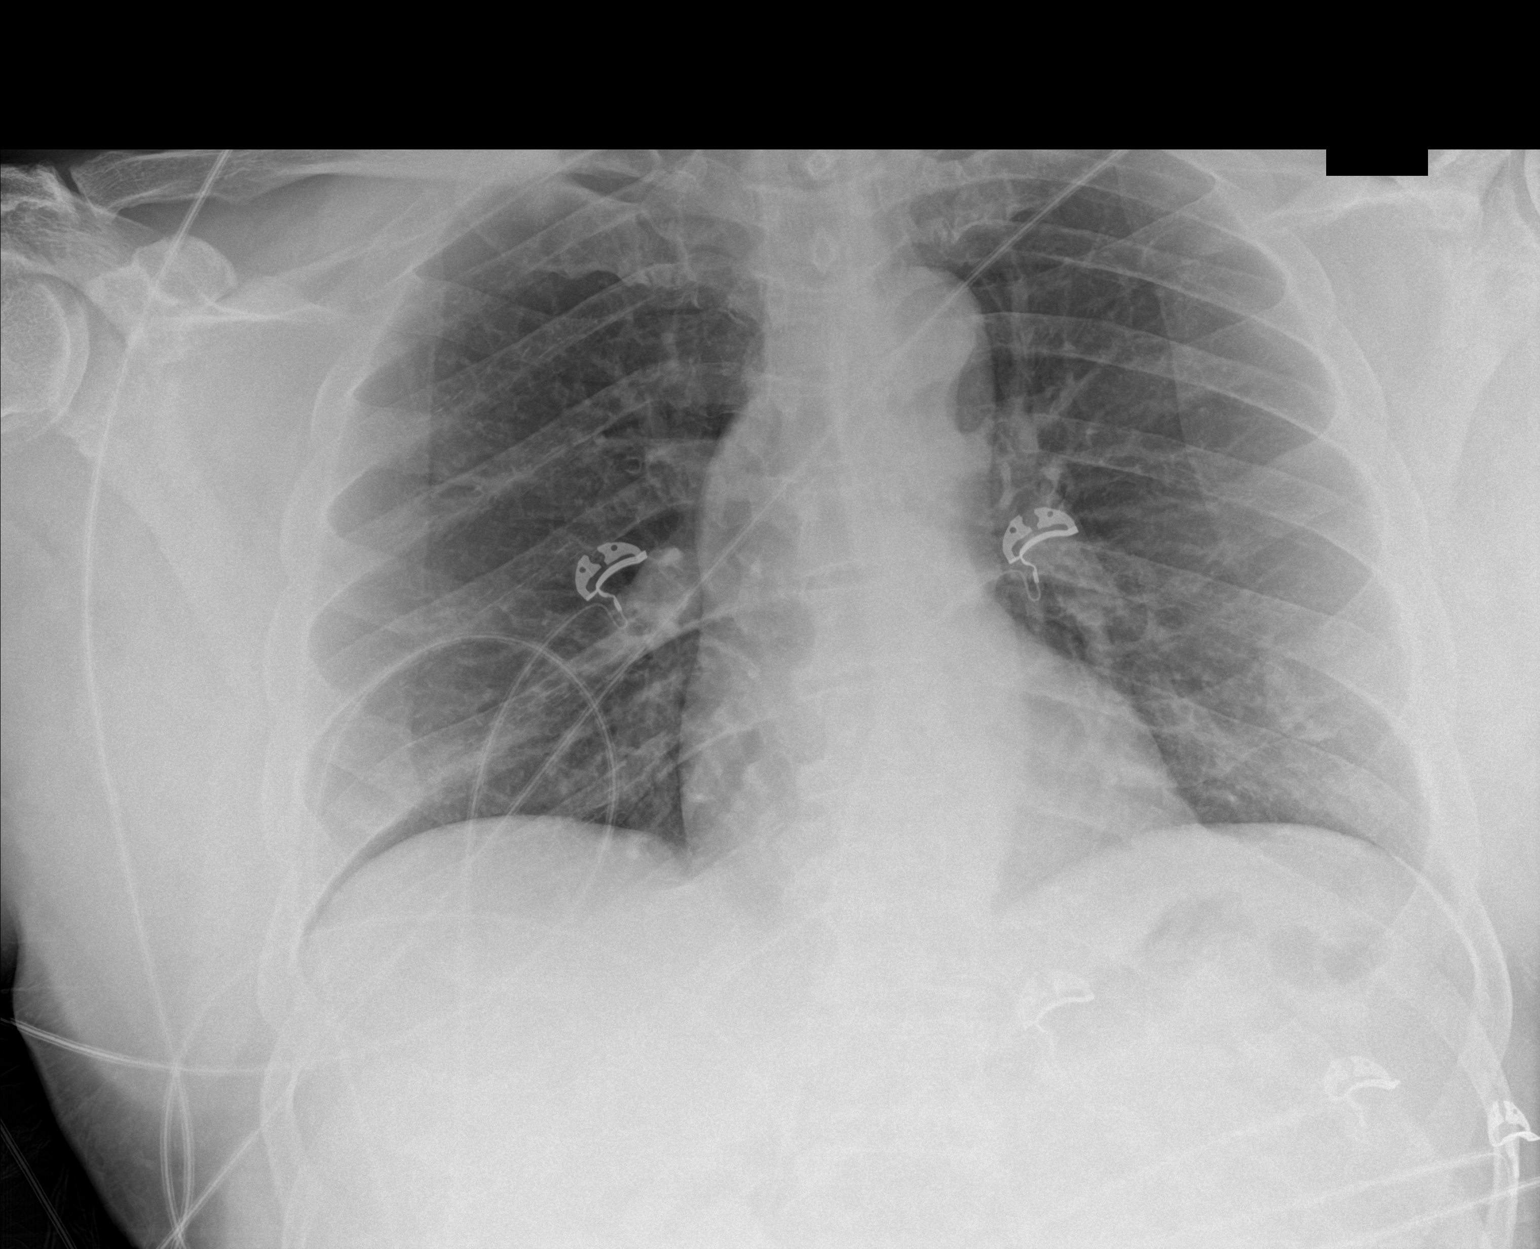

[1 of 1 positions shown; findings below may reference images not displayed]

FINDINGS: Normal heart size and mediastinal contours. No acute infiltrate or
edema. No effusion or pneumothorax. No acute osseous findings.
IMPRESSION: Negative chest.

## 2020-11-25 ENCOUNTER — Emergency Department
Admission: EM | Admit: 2020-11-25 | Discharge: 2020-11-25 | Disposition: A | Discharge status: Home / Self Care | Payer: No Typology Code available for payment source | Attending: Emergency Medicine | Admitting: Emergency Medicine

## 2020-11-25 ENCOUNTER — Emergency Department: Payer: No Typology Code available for payment source

## 2020-11-25 ENCOUNTER — Encounter: Payer: Self-pay | Admitting: Emergency Medicine

## 2020-11-25 ENCOUNTER — Other Ambulatory Visit: Payer: Self-pay

## 2020-11-25 DIAGNOSIS — Z7982 Long term (current) use of aspirin: Secondary | ICD-10-CM | POA: Diagnosis not present

## 2020-11-25 DIAGNOSIS — R109 Unspecified abdominal pain: Secondary | ICD-10-CM

## 2020-11-25 DIAGNOSIS — R1031 Right lower quadrant pain: Secondary | ICD-10-CM | POA: Diagnosis not present

## 2020-11-25 DIAGNOSIS — Z79899 Other long term (current) drug therapy: Secondary | ICD-10-CM | POA: Diagnosis not present

## 2020-11-25 DIAGNOSIS — I10 Essential (primary) hypertension: Secondary | ICD-10-CM | POA: Diagnosis not present

## 2020-11-25 DIAGNOSIS — R197 Diarrhea, unspecified: Secondary | ICD-10-CM | POA: Insufficient documentation

## 2020-11-25 DIAGNOSIS — R590 Localized enlarged lymph nodes: Secondary | ICD-10-CM | POA: Insufficient documentation

## 2020-11-25 DIAGNOSIS — R1011 Right upper quadrant pain: Secondary | ICD-10-CM | POA: Diagnosis not present

## 2020-11-25 DIAGNOSIS — R112 Nausea with vomiting, unspecified: Secondary | ICD-10-CM | POA: Insufficient documentation

## 2020-11-25 LAB — COMPREHENSIVE METABOLIC PANEL
ALT: 21 U/L (ref 0–44)
AST: 21 U/L (ref 15–41)
Albumin: 4.1 g/dL (ref 3.5–5.0)
Alkaline Phosphatase: 58 U/L (ref 38–126)
Anion gap: 7 (ref 5–15)
BUN: 11 mg/dL (ref 6–20)
CO2: 26 mmol/L (ref 22–32)
Calcium: 9 mg/dL (ref 8.9–10.3)
Chloride: 105 mmol/L (ref 98–111)
Creatinine, Ser: 1.03 mg/dL (ref 0.61–1.24)
GFR, Estimated: >60 mL/min (ref >60)
Glucose, Bld: 100 mg/dL — ABNORMAL HIGH (ref 70–99)
Potassium: 4.2 mmol/L (ref 3.5–5.1)
Sodium: 138 mmol/L (ref 135–145)
Total Bilirubin: 0.9 mg/dL (ref 0.3–1.2)
Total Protein: 7.7 g/dL (ref 6.5–8.1)

## 2020-11-25 LAB — CBC
HCT: 44.3 % (ref 39.0–52.0)
Hemoglobin: 15.6 g/dL (ref 13.0–17.0)
MCH: 27.9 pg (ref 26.0–34.0)
MCHC: 35.2 g/dL (ref 30.0–36.0)
MCV: 79.2 fL — ABNORMAL LOW (ref 80.0–100.0)
Platelets: 291 10*3/uL (ref 150–400)
RBC: 5.59 MIL/uL (ref 4.22–5.81)
RDW: 13.4 % (ref 11.5–15.5)
WBC: 5.5 10*3/uL (ref 4.0–10.5)
nRBC: 0 % (ref 0.0–0.2)

## 2020-11-25 LAB — LIPASE, BLOOD: Lipase: 47 U/L (ref 11–51)

## 2020-11-25 LAB — URINALYSIS, ROUTINE W REFLEX MICROSCOPIC
Bilirubin Urine: NEGATIVE
Glucose, UA: NEGATIVE mg/dL
Hgb urine dipstick: NEGATIVE
Ketones, ur: NEGATIVE mg/dL
Leukocytes,Ua: NEGATIVE
Nitrite: NEGATIVE
Protein, ur: NEGATIVE mg/dL
Specific Gravity, Urine: 1.015 (ref 1.005–1.030)
pH: 5 (ref 5.0–8.0)

## 2020-11-25 LAB — TROPONIN I (HIGH SENSITIVITY): Troponin I (High Sensitivity): 3 ng/L (ref <18)

## 2020-11-25 MED ORDER — NAPROXEN 250 MG PO TABS
250.0000 mg | ORAL_TABLET | Freq: Two times a day (BID) | ORAL | 0 refills | Status: AC
Start: 2020-11-25 — End: 2020-12-02

## 2020-11-25 MED ORDER — IOHEXOL 350 MG/ML SOLN
100.0000 mL | Freq: Once | INTRAVENOUS | Status: AC | PRN
  Administered 2020-11-25: 100 mL via INTRAVENOUS
  Filled 2020-11-25: qty 100

## 2020-11-25 NOTE — ED Triage Notes (Signed)
Pt via POV from home. Pt RUQ, RLQ that radiates to his back, chills, diarrhea, and nausea. Denies abd surgeries. Pt is A&OX4 and NAD.

## 2020-11-25 NOTE — ED Provider Notes (Signed)
[facility]  ____________________________________________   Event Date/Time   First MD Initiated Contact with Patient 11/25/20 1557     (approximate)  I have reviewed the triage vital signs and the nursing notes.   HISTORY  Chief Complaint Abdominal Pain    HPI Juan Ramos is a 55 y.o. male with past medical history of hypertension who presents with abdominal pain.  Symptoms started about 2 to 3 days ago.  Pain is located both in the right upper and right lower quadrant pain is constant but worse after eating.  He has had associated nausea vomiting and diarrhea.  No fevers or chills.  Has never had similar pain before.  No abdominal surgeries.  Denies any urinary symptoms.         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 Authorizing Provider  naproxen (NAPROSYN) 250 MG tablet Take 1 tablet (250 mg total) by mouth 2 (two) times daily with a meal for 7 days. 11/25/20 12/02/20 Yes Georga Hacking, MD  Aspirin-Calcium Carbonate [phone] MG TABS Take by mouth.    [provider]  atenolol (TENORMIN) 25  MG tablet Take by mouth.    [provider]  diclofenac sodium (VOLTAREN) 1 % GEL Place onto the skin.    [provider]  dicyclomine (BENTYL) 10 MG capsule Take by mouth.    [provider]  DULoxetine (CYMBALTA) 30 MG capsule Take by mouth.    [provider]  loratadine (CLARITIN) 10 MG tablet Take by mouth.    [provider]  naproxen (NAPROSYN) 500 MG tablet Take by mouth.    [provider]  oxybutynin (DITROPAN-XL) 10 MG 24 hr tablet Take by mouth. 11/15/17   [provider]  pantoprazole (PROTONIX) 40 MG tablet Take by mouth.    [provider]  polyethylene glycol (MIRALAX / GLYCOLAX) packet Take by mouth.    [provider]  sildenafil (VIAGRA) 100 MG tablet Take by mouth.    [provider]  SUMAtriptan (IMITREX) 100 MG tablet Take by mouth.    [provider]  SUMAtriptan 6 MG/0.5ML SOAJ Inject into the skin.    [provider]  topiramate (TOPAMAX) 25 MG capsule Take by mouth.    [provider]  venlafaxine (EFFEXOR) 75 MG tablet Take by mouth.    [provider]    Allergies Shellfish allergy  Family History  Problem Relation Age of Onset   Mental illness Neg Hx     Social History Social History   Tobacco Use   Smoking status: Never  Smokeless tobacco: Never  Substance Use Topics   Alcohol use: No   Drug use: No    Review of Systems   Review of Systems  Constitutional:  Negative for chills and fever.  Gastrointestinal:  Positive for abdominal pain, diarrhea, nausea and vomiting.  Genitourinary:  Negative for dysuria.  All other systems reviewed and are negative.  Physical Exam Updated Vital Signs BP 137/84 (BP Location: Left Arm)   Pulse 75   Temp 98.1 F (36.7 C) (Oral)   Resp 17   Ht 6\' 6"  (1.981 m)   Wt (!) 139.3 kg   SpO2 98%   BMI 35.48 kg/m   Physical Exam Vitals and nursing note reviewed.  Constitutional:       General: He is not in acute distress.    Appearance: Normal appearance.  HENT:     Head: Normocephalic and atraumatic.  Eyes:     General: No scleral icterus.    Conjunctiva/sclera: Conjunctivae normal.  Pulmonary:     Effort: Pulmonary effort is normal. No respiratory distress.     Breath sounds: Normal breath sounds. No wheezing.  Abdominal:     General: Abdomen is flat.     Tenderness: There is abdominal tenderness.     Comments: Tenderness to palpation in the right lower quadrant with voluntary guarding, mild tenderness palpation the right upper quadrant, negative Murphy sign  Musculoskeletal:        General: No deformity or signs of injury.     Cervical back: Normal range of motion.  Skin:    Coloration: Skin is not jaundiced or pale.  Neurological:     General: No focal deficit present.     Mental Status: He is alert and oriented to person, place, and time. Mental status is at baseline.  Psychiatric:        Mood and Affect: Mood normal.        Behavior: Behavior normal.     LABS (all labs ordered are listed, but only abnormal results are displayed)  Labs Reviewed  COMPREHENSIVE METABOLIC PANEL - Abnormal; Notable for the following components:      Result Value   Glucose, Bld 100 (*)    All other components within normal limits  CBC - Abnormal; Notable for the following components:   MCV 79.2 (*)    All other components within normal limits  URINALYSIS, ROUTINE W REFLEX MICROSCOPIC - Abnormal; Notable for the following components:   Color, Urine YELLOW (*)    APPearance CLEAR (*)    All other components within normal limits  LIPASE, BLOOD  TROPONIN I (HIGH SENSITIVITY)  TROPONIN I (HIGH SENSITIVITY)   ____________________________________________  EKG  N/a ____________________________________________  RADIOLOGY , personally viewed and evaluated these images (plain radiographs) as part of my medical decision making, as well as reviewing the  written report by the radiologist.  ED MD interpretation: I reviewed the CT scan of the abdomen and pelvis which is negative for appendicitis, does show some enlarged mesenteric lymph nodes    ____________________________________________   PROCEDURES  Procedure(s) performed (including Critical Care):  Procedures   ____________________________________________   INITIAL IMPRESSION / ASSESSMENT AND PLAN / ED COURSE     55 year old male presents with 2 to 3 days of abdominal pain.  Initially described more as right upper quadrant pain worse after eating however on exam he is primarily tender in the right lower quadrant.  He has had nausea vomiting and diarrhea.  Vital signs within normal  limits and he overall appears well.  Labs are all reassuring, no leukocytosis normal lipase and AST/ALT.  Will obtain a CT abdomen pelvis with contrast to rule out appendicitis.  May also need to have an ultrasound to further characterize the gallbladder if CT normal.  CT abdomen pelvis was obtained which is negative for appendicitis or other acute surgical process.  There is mention of enlarged " misty appearing" mesenteric lymph nodes which could be consistent with mesenteric panniculitis but is otherwise nonspecific.  Repeat evaluation the patient's abdominal exam remains benign.  Discussed the findings including the enlarged lymph nodes and the nonspecific nature of this but could represent nonspecific inflammation versus infection.  Given he is afebrile with no leukocytosis I am overall reassured.  I advised that he take NSAIDs for the pain and follow-up with his primary care provider.  We did reassess describe precautions for worsening pain fevers or inability to tolerate p.o.     ____________________________________________   FINAL CLINICAL IMPRESSION(S) / ED DIAGNOSES  Final diagnoses:  Abdominal pain, unspecified abdominal location  Mesenteric lymphadenopathy     ED Discharge Orders           Ordered    naproxen (NAPROSYN) 250 MG tablet  2 times daily with meals        11/25/20 1921             Note:  This document was prepared using Dragon voice recognition software and may include unintentional dictation errors.    Georga Hacking, MD 11/25/20 2202

## 2020-11-25 NOTE — Discharge Instructions (Addendum)
Your blood work and urine sample reassuring.  Your CAT scan shows some prominent lymph nodes in your abdomen which in not a specific finding but could reflect an infectious process, like gastroenteritis.  Your appendix, gallbladder and liver were all looks normal.  If your pain does not improve or is worsening, please return to the emergency department.  Follow-up with your primary care provider regarding your enlarged lymph nodes.

## 2021-03-31 ENCOUNTER — Emergency Department: Payer: No Typology Code available for payment source

## 2021-03-31 ENCOUNTER — Emergency Department
Admission: EM | Admit: 2021-03-31 | Discharge: 2021-04-01 | Disposition: A | Discharge status: Home / Self Care | Payer: No Typology Code available for payment source | Attending: Emergency Medicine | Admitting: Emergency Medicine

## 2021-03-31 DIAGNOSIS — R079 Chest pain, unspecified: Secondary | ICD-10-CM

## 2021-03-31 DIAGNOSIS — R0600 Dyspnea, unspecified: Secondary | ICD-10-CM | POA: Diagnosis not present

## 2021-03-31 DIAGNOSIS — R61 Generalized hyperhidrosis: Secondary | ICD-10-CM | POA: Diagnosis not present

## 2021-03-31 DIAGNOSIS — R0789 Other chest pain: Secondary | ICD-10-CM | POA: Insufficient documentation

## 2021-03-31 DIAGNOSIS — R1031 Right lower quadrant pain: Secondary | ICD-10-CM | POA: Diagnosis not present

## 2021-03-31 DIAGNOSIS — I10 Essential (primary) hypertension: Secondary | ICD-10-CM | POA: Diagnosis not present

## 2021-03-31 LAB — COMPREHENSIVE METABOLIC PANEL
ALT: 20 U/L (ref 0–44)
AST: 29 U/L (ref 15–41)
Albumin: 3.9 g/dL (ref 3.5–5.0)
Alkaline Phosphatase: 59 U/L (ref 38–126)
Anion gap: 6 (ref 5–15)
BUN: 16 mg/dL (ref 6–20)
CO2: 28 mmol/L (ref 22–32)
Calcium: 9 mg/dL (ref 8.9–10.3)
Chloride: 106 mmol/L (ref 98–111)
Creatinine, Ser: 1.22 mg/dL (ref 0.61–1.24)
GFR, Estimated: >60 mL/min (ref >60)
Glucose, Bld: 95 mg/dL (ref 70–99)
Potassium: 4.3 mmol/L (ref 3.5–5.1)
Sodium: 140 mmol/L (ref 135–145)
Total Bilirubin: 0.8 mg/dL (ref 0.3–1.2)
Total Protein: 7.5 g/dL (ref 6.5–8.1)

## 2021-03-31 LAB — CBC WITH DIFFERENTIAL/PLATELET
Abs Immature Granulocytes: 0.01 10*3/uL (ref 0.00–0.07)
Basophils Absolute: 0 10*3/uL (ref 0.0–0.1)
Basophils Relative: 1 %
Eosinophils Absolute: 0.2 10*3/uL (ref 0.0–0.5)
Eosinophils Relative: 3 %
HCT: 43.8 % (ref 39.0–52.0)
Hemoglobin: 15.1 g/dL (ref 13.0–17.0)
Immature Granulocytes: 0 %
Lymphocytes Relative: 47 %
Lymphs Abs: 2.8 10*3/uL (ref 0.7–4.0)
MCH: 27.8 pg (ref 26.0–34.0)
MCHC: 34.5 g/dL (ref 30.0–36.0)
MCV: 80.7 fL (ref 80.0–100.0)
Monocytes Absolute: 0.6 10*3/uL (ref 0.1–1.0)
Monocytes Relative: 9 %
Neutro Abs: 2.4 10*3/uL (ref 1.7–7.7)
Neutrophils Relative %: 40 %
Platelets: 261 10*3/uL (ref 150–400)
RBC: 5.43 MIL/uL (ref 4.22–5.81)
RDW: 13.3 % (ref 11.5–15.5)
WBC: 6 10*3/uL (ref 4.0–10.5)
nRBC: 0 % (ref 0.0–0.2)

## 2021-03-31 LAB — TROPONIN I (HIGH SENSITIVITY): Troponin I (High Sensitivity): 4 ng/L (ref <18)

## 2021-03-31 LAB — LIPASE, BLOOD: Lipase: 38 U/L (ref 11–51)

## 2021-03-31 MED ORDER — NITROGLYCERIN 0.4 MG SL SUBL
0.4000 mg | SUBLINGUAL_TABLET | SUBLINGUAL | Status: DC | PRN
  Administered 2021-03-31 (×2): 0.4 mg via SUBLINGUAL
  Filled 2021-03-31: qty 1

## 2021-03-31 MED ORDER — ACETAMINOPHEN 500 MG PO TABS
1000.0000 mg | ORAL_TABLET | Freq: Once | ORAL | Status: AC
  Administered 2021-03-31: 1000 mg via ORAL
  Filled 2021-03-31: qty 2

## 2021-03-31 NOTE — ED Notes (Signed)
Pt return from xray.

## 2021-03-31 NOTE — ED Notes (Signed)
Pt to xray

## 2021-03-31 NOTE — ED Triage Notes (Addendum)
Pt here from home for c/o  chest pain across the ches, non-radiating.  pain score 9/10 constant started 1hr ago.  Aspirin 324mg  PO given PTA.  PMHX: HTN, CP  ?

## 2021-03-31 NOTE — ED Notes (Addendum)
ED Provider at bedside. Primary RN notified of this pt  ?

## 2021-03-31 NOTE — ED Provider Notes (Signed)
? ?[facility] ?Provider Note ? ? ? Event Date/Time  ? First MD Initiated Contact with Patient 03/31/21 2104   ?  (approximate) ? ? ?History  ? ?Chest Pain ? ? ?HPI ? ?Juan Ramos is a 56 y.o. male with past medical history of hypertension who presents chest pain.  Symptoms started about 1 hour prior to arrival.  Started after he was reading a book about the [employer] when he got to the [place] he recalled that when he was in [employer] he lost two of the men he was with.  Described as pressure-like left side of his chest rating around to the left shoulder.  It is nonexertional ?He moves his body.  He has mild associated dyspnea and did feel somewhat sweaty denies nausea.  Patient also complaining of right lower quadrant pain has been more chronic.  Does seem to have worsened since the chest pain came on.  No fevers or chills.  Tells me he has had chest pain in the past but not as bad as this. ?  ? ?Past Medical History:  ?Diagnosis Date  ? Hypertension   ? ? ?Patient Active Problem List  ? Diagnosis Date Noted  ? PTSD (post-traumatic stress disorder) 08/15/2018  ? Bipolar I disorder (HCC) 08/15/2018  ? Cognitive disorder 08/15/2018  ? History of traumatic brain injury 08/15/2018  ? Neck pain 08/03/2018  ? Olecranon bursitis of left elbow 08/03/2018  ? Osteoarthritis of left shoulder 08/03/2018  ? Left elbow pain 07/31/2018  ? Right hip pain 07/27/2018  ? 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 Urinary urgency 12/22/2017  ? 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 Chronic left shoulder pain 04/06/2017  ? Bilateral sciatica 03/24/2017  ? Chronic migraine 03/24/2017  ? ? ? ?Physical Exam  ?Triage Vital Signs: ?ED Triage Vitals  ?Enc Vitals Group  ?   BP 03/31/21 2105 (!) 151/101  ?   Pulse Rate 03/31/21 2105 96  ?   Resp 03/31/21 2105 18  ?   Temp 03/31/21 2105 97.9 ?F (36.6 ?C)  ?   Temp Source 03/31/21 2105 Oral  ?   SpO2  03/31/21 2105 99 %  ?   Weight 03/31/21 2103 (!) 310 lb (140.6 kg)  ?   Height 03/31/21 2103 6\' 5"  (1.956 m)  ?   Head Circumference --   ?   Peak Flow --   ?   Pain Score --   ?   Pain Loc --   ?   Pain Edu? --   ?   Excl. in GC? --   ? ? ?Most recent vital signs: ?Vitals:  ? 03/31/21 2230 03/31/21 2300  ?BP: 120/76 116/66  ?Pulse: 83 83  ?Resp: 12 (!) 23  ?Temp:    ?SpO2: 97% 97%  ? ? ? ?General: Awake, no distress.  ?CV:  Good peripheral perfusion. No edema ?Resp:  Normal effort.  ?Abd:  No distention.  Mild tenderness in the bilateral lower quadrants ?Neuro:             Awake, Alert, Oriented x 3  ?Other:  L anterior chest wall ttp ? ? ?ED Results / Procedures / Treatments  ?Labs ?(all labs ordered are listed, but only abnormal results are displayed) ?Labs Reviewed  ?COMPREHENSIVE METABOLIC PANEL  ?CBC WITH DIFFERENTIAL/PLATELET  ?LIPASE, BLOOD  ?TROPONIN I (HIGH SENSITIVITY)  ?TROPONIN I (HIGH SENSITIVITY)  ? ? ? ?EKG ? ?  EKG reviewed by myself, normal sinus rhythm normal axis normal intervals, nonspecific T wave changes in the inferior leads and lateral precordial leads similar to prior EKG from 11/25/2020 ? ?RADIOLOGY ?I reviewed the CXR which does not show any acute cardiopulmonary process; agree with radiology report  ? ? ? ?PROCEDURES: ? ?Critical Care performed: No ? ?.1-3 Lead EKG Interpretation ?Performed by: Georga Hacking, MD ?Authorized by: Georga Hacking, MD  ? ?  Interpretation: normal   ?  ECG rate assessment: normal   ?  Ectopy: none   ?  Conduction: normal   ? ?The patient is on the cardiac monitor to evaluate for evidence of arrhythmia and/or significant heart rate changes. ? ? ?MEDICATIONS ORDERED IN ED: ?Medications  ?nitroGLYCERIN (NITROSTAT) SL tablet 0.4 mg (0.4 mg Sublingual Not Given 03/31/21 2219)  ?acetaminophen (TYLENOL) tablet 1,000 mg (1,000 mg Oral Given 03/31/21 2251)  ? ? ? ?IMPRESSION / MDM / ASSESSMENT AND PLAN / ED COURSE  ?I reviewed the triage vital signs and the nursing  notes. ?             ?               ? ?Differential diagnosis includes, but is not limited to, unstable angina, NSTEMI, musculoskeletal, reflux, less likely aortic dissection, pulmonary embolism ? ? ?56 year old male with past medical history of hypertension who presents with chest pain x1 hour.  Started after he got upset reading a book about the war.  Described as pressure-like worse with movement is not exertional.  Does have some associated sweats and dyspnea.  Patient's vital signs are largely unremarkable.  He appears quite comfortable.  Chest pain does seem to be somewhat reproducible by palpation on the left anterior chest wall.  Patient also complaining of secondary complaint of right lower quadrant pain which before seem to start at the same time as the chest pain.  Abdomen overall is benign mild tenderness in the bilateral lower quadrants.  EKG reviewed by myself shows normal sinus rhythm with some T wave abnormalities in the inferior leads that appear similar to his prior EKG in 2022.  Did try nitroglycerin to see if this helped with his pain and on reevaluation patient's pain is largely resolved unclear if this was from the nitro or just with time.  His initial troponin is negative.  We will plan to repeat.  Remainder of his labs including CBC CMP and lipase are largely reassuring he has no leukocytosis.  ? ?He is pending repeat troponin at the time of signout.  If negative patient is mostly pain-free I think that outpatient cardiology follow-up with strict return precautions will be appropriate. ? ?  ? ? ?FINAL CLINICAL IMPRESSION(S) / ED DIAGNOSES  ? ?Final diagnoses:  ?Chest pain, unspecified type  ? ? ? ?Rx / DC Orders  ? ?ED Discharge Orders   ? ? None  ? ?  ? ? ? ?Note:  This document was prepared using Dragon voice recognition software and may include unintentional dictation errors. ?  ?Georga Hacking, MD ?03/31/21 2326 ? ?

## 2021-04-01 ENCOUNTER — Emergency Department: Payer: No Typology Code available for payment source

## 2021-04-01 LAB — TROPONIN I (HIGH SENSITIVITY): Troponin I (High Sensitivity): 4 ng/L

## 2021-04-01 MED ORDER — ALUM & MAG HYDROXIDE-SIMETH 200-200-20 MG/5ML PO SUSP
30.0000 mL | Freq: Once | ORAL | Status: AC
  Administered 2021-04-01: 30 mL via ORAL
  Filled 2021-04-01: qty 30

## 2021-04-01 MED ORDER — FENTANYL CITRATE PF 50 MCG/ML IJ SOSY
50.0000 ug | PREFILLED_SYRINGE | Freq: Once | INTRAMUSCULAR | Status: AC
  Administered 2021-04-01: 50 ug via INTRAVENOUS
  Filled 2021-04-01: qty 1

## 2021-04-01 MED ORDER — ONDANSETRON HCL 4 MG/2ML IJ SOLN
4.0000 mg | Freq: Once | INTRAMUSCULAR | Status: AC
  Administered 2021-04-01: 4 mg via INTRAVENOUS
  Filled 2021-04-01: qty 2

## 2021-04-01 MED ORDER — IOHEXOL 350 MG/ML SOLN
125.0000 mL | Freq: Once | INTRAVENOUS | Status: AC | PRN
  Administered 2021-04-01: 125 mL via INTRAVENOUS

## 2021-04-01 NOTE — ED Notes (Signed)
Patient in CT

## 2021-04-01 NOTE — ED Provider Notes (Signed)
I accepted care of this patient from Dr. Sidney Ace at midnight pending repeat troponin.  Repeat troponin was negative.  I went back to reevaluate patient who says that he was still having pretty significant chest pain that was now moving down to his upper abdomen area.  Therefore patient was sent for CT angio to rule out dissection which was negative.  Also negative for PE or any other acute abnormalities.  Before ordering the CT I ordered a dose of IV fentanyl and Zofran.  After the CT patient was also given a dose of Maalox.  His symptoms have now subsided.  He remains well-appearing with a nonfocal exam.  At this time with a negative work-up and pain has resolved I feel the patient is safe for discharge home although admission was considered.  I recommended close follow-up with his primary care doctor and return to the hospital if the pain recurs. ? ? ?I, Nita Sickle, attending MD, have personally viewed and interpreted the images obtained during this visit as below: ? ?CTA negative for PE or dissection ? ? ?___________________________________________________ ?Interpretation by Radiologist:  ?DG Chest 2 View ? ?Result Date: 03/31/2021 ?CLINICAL DATA:  Chest pain. EXAM: CHEST - 2 VIEW COMPARISON:  Chest radiograph dated 05/02/2019. FINDINGS: The lungs are clear. There is no pleural effusion pneumothorax. The cardiac silhouette is within normal limits. No acute osseous pathology. IMPRESSION: No active cardiopulmonary disease. Electronically Signed   By: Elgie Collard M.D.   On: 03/31/2021 22:15  ? ?CT Angio Chest/Abd/Pel for Dissection W and/or Wo Contrast ? ?Result Date: 04/01/2021 ?CLINICAL DATA:  Acute aortic syndrome suspected. Chest pain with history of hypertension. EXAM: CT ANGIOGRAPHY CHEST, ABDOMEN AND PELVIS TECHNIQUE: Non-contrast CT of the chest was initially obtained. Multidetector CT imaging through the chest, abdomen and pelvis was performed using the standard protocol during bolus administration  of intravenous contrast. Multiplanar reconstructed images and MIPs were obtained and reviewed to evaluate the vascular anatomy. RADIATION DOSE REDUCTION: This exam was performed according to the departmental dose-optimization program which includes automated exposure control, adjustment of the mA and/or kV according to patient size and/or use of iterative reconstruction technique. CONTRAST:  OMNIPAQUE IOHEXOL 350 MG/ML SOLN COMPARISON:  CT chest 05/02/2019.  CT abdomen and pelvis 11/25/2020 FINDINGS: CTA CHEST FINDINGS Cardiovascular: Unenhanced images of the chest demonstrate no significant vascular calcification. No evidence of intramural hematoma. Images obtained during arterial phase after contrast administration demonstrate normal caliber thoracic aorta. No aneurysm or dissection. Great vessel origins are patent. Central pulmonary arteries are patent. No evidence of significant pulmonary embolus. Normal heart size. No pericardial effusions. Mediastinum/Nodes: Esophagus is decompressed. No significant lymphadenopathy. Thyroid gland is unremarkable. Lungs/Pleura: Lungs are clear. No pleural effusion or pneumothorax. Musculoskeletal: No chest wall abnormality. No acute or significant osseous findings. Review of the MIP images confirms the above findings. CTA ABDOMEN AND PELVIS FINDINGS VASCULAR Aorta: Normal caliber aorta without aneurysm, dissection, vasculitis or significant stenosis. Celiac: Patent without evidence of aneurysm, dissection, vasculitis or significant stenosis. SMA: Patent without evidence of aneurysm, dissection, vasculitis or significant stenosis. Renals: Both renal arteries are patent without evidence of aneurysm, dissection, vasculitis, fibromuscular dysplasia or significant stenosis. IMA: Patent without evidence of aneurysm, dissection, vasculitis or significant stenosis. Inflow: Patent without evidence of aneurysm, dissection, vasculitis or significant stenosis. Veins: No obvious  venous abnormality within the limitations of this arterial phase study. Review of the MIP images confirms the above findings. NON-VASCULAR Hepatobiliary: No focal liver abnormality is seen. No gallstones, gallbladder wall  thickening, or biliary dilatation. Pancreas: Unremarkable. No pancreatic ductal dilatation or surrounding inflammatory changes. Spleen: Normal in size without focal abnormality. Adrenals/Urinary Tract: Adrenal glands are unremarkable. Kidneys are normal, without renal calculi, focal lesion, or hydronephrosis. Bladder is unremarkable. Stomach/Bowel: Stomach is within normal limits. Appendix appears normal. No evidence of bowel wall thickening, distention, or inflammatory changes. Lymphatic: No significant lymphadenopathy. Reproductive: Prostate is unremarkable. Other: No free air or free fluid in the abdomen. Abdominal wall musculature appears intact. Musculoskeletal: No acute or significant osseous findings. Review of the MIP images confirms the above findings. IMPRESSION: 1. No evidence of aneurysm or dissection involving the thoracic or abdominal aorta. 2. No evidence of significant pulmonary embolus. 3. Lungs are clear. 4. No acute process demonstrated in the abdomen or pelvis. Electronically Signed   By: Burman Nieves M.D.   On: 04/01/2021 01:46   ? ?  ?Nita Sickle, MD ?04/01/21 6073 ? ?

## 2021-04-01 NOTE — Discharge Instructions (Signed)

## 2021-04-21 ENCOUNTER — Other Ambulatory Visit: Payer: Self-pay | Admitting: Internal Medicine

## 2021-04-21 ENCOUNTER — Other Ambulatory Visit (HOSPITAL_COMMUNITY): Payer: Self-pay | Admitting: Internal Medicine

## 2021-04-21 DIAGNOSIS — R109 Unspecified abdominal pain: Secondary | ICD-10-CM

## 2021-04-21 DIAGNOSIS — K769 Liver disease, unspecified: Secondary | ICD-10-CM

## 2021-04-24 ENCOUNTER — Ambulatory Visit (HOSPITAL_COMMUNITY)
Admission: RE | Admit: 2021-04-24 | Discharge: 2021-04-24 | Disposition: A | Discharge status: Home / Self Care | Payer: No Typology Code available for payment source | Source: Ambulatory Visit | Attending: Internal Medicine | Admitting: Internal Medicine

## 2021-04-24 DIAGNOSIS — K769 Liver disease, unspecified: Secondary | ICD-10-CM | POA: Diagnosis not present

## 2021-04-24 DIAGNOSIS — R109 Unspecified abdominal pain: Secondary | ICD-10-CM | POA: Insufficient documentation

## 2021-04-24 MED ORDER — IOHEXOL 300 MG/ML  SOLN
100.0000 mL | Freq: Once | INTRAMUSCULAR | Status: AC | PRN
  Administered 2021-04-24: 100 mL via INTRAVENOUS

## 2021-05-01 ENCOUNTER — Other Ambulatory Visit (HOSPITAL_COMMUNITY): Payer: No Typology Code available for payment source

## 2022-03-19 DIAGNOSIS — Z419 Encounter for procedure for purposes other than remedying health state, unspecified: Secondary | ICD-10-CM | POA: Diagnosis not present

## 2022-04-09 DIAGNOSIS — H53483 Generalized contraction of visual field, bilateral: Secondary | ICD-10-CM | POA: Diagnosis not present

## 2022-04-09 DIAGNOSIS — H547 Unspecified visual loss: Secondary | ICD-10-CM | POA: Diagnosis not present

## 2022-04-19 DIAGNOSIS — Z419 Encounter for procedure for purposes other than remedying health state, unspecified: Secondary | ICD-10-CM | POA: Diagnosis not present

## 2022-05-19 DIAGNOSIS — Z419 Encounter for procedure for purposes other than remedying health state, unspecified: Secondary | ICD-10-CM | POA: Diagnosis not present

## 2022-05-25 ENCOUNTER — Telehealth: Payer: Self-pay

## 2022-05-25 NOTE — Telephone Encounter (Signed)
LVM for patient to call back. AS, CMA 

## 2022-06-19 DIAGNOSIS — Z419 Encounter for procedure for purposes other than remedying health state, unspecified: Secondary | ICD-10-CM | POA: Diagnosis not present

## 2022-07-19 DIAGNOSIS — Z419 Encounter for procedure for purposes other than remedying health state, unspecified: Secondary | ICD-10-CM | POA: Diagnosis not present

## 2022-08-19 DIAGNOSIS — Z419 Encounter for procedure for purposes other than remedying health state, unspecified: Secondary | ICD-10-CM | POA: Diagnosis not present

## 2022-09-19 DIAGNOSIS — Z419 Encounter for procedure for purposes other than remedying health state, unspecified: Secondary | ICD-10-CM | POA: Diagnosis not present

## 2022-10-19 DIAGNOSIS — Z419 Encounter for procedure for purposes other than remedying health state, unspecified: Secondary | ICD-10-CM | POA: Diagnosis not present

## 2022-11-18 ENCOUNTER — Emergency Department: Payer: Non-veteran care

## 2022-11-18 ENCOUNTER — Encounter: Payer: Self-pay | Admitting: Medical Oncology

## 2022-11-18 ENCOUNTER — Emergency Department
Admission: EM | Admit: 2022-11-18 | Discharge: 2022-11-18 | Disposition: A | Discharge status: Home / Self Care | Payer: Non-veteran care | Attending: Emergency Medicine | Admitting: Emergency Medicine

## 2022-11-18 ENCOUNTER — Other Ambulatory Visit: Payer: Self-pay

## 2022-11-18 DIAGNOSIS — R0789 Other chest pain: Secondary | ICD-10-CM | POA: Diagnosis not present

## 2022-11-18 DIAGNOSIS — T148XXA Other injury of unspecified body region, initial encounter: Secondary | ICD-10-CM | POA: Diagnosis not present

## 2022-11-18 DIAGNOSIS — R000 Tachycardia, unspecified: Secondary | ICD-10-CM | POA: Diagnosis not present

## 2022-11-18 DIAGNOSIS — M549 Dorsalgia, unspecified: Secondary | ICD-10-CM | POA: Diagnosis not present

## 2022-11-18 DIAGNOSIS — T07XXXA Unspecified multiple injuries, initial encounter: Secondary | ICD-10-CM

## 2022-11-18 DIAGNOSIS — I10 Essential (primary) hypertension: Secondary | ICD-10-CM | POA: Insufficient documentation

## 2022-11-18 DIAGNOSIS — W19XXXA Unspecified fall, initial encounter: Secondary | ICD-10-CM

## 2022-11-18 DIAGNOSIS — W01198A Fall on same level from slipping, tripping and stumbling with subsequent striking against other object, initial encounter: Secondary | ICD-10-CM | POA: Insufficient documentation

## 2022-11-18 DIAGNOSIS — S0990XA Unspecified injury of head, initial encounter: Secondary | ICD-10-CM | POA: Insufficient documentation

## 2022-11-18 LAB — CBC WITH DIFFERENTIAL/PLATELET
Abs Immature Granulocytes: 0.02 10*3/uL (ref 0.00–0.07)
Basophils Absolute: 0 10*3/uL (ref 0.0–0.1)
Basophils Relative: 0 %
Eosinophils Absolute: 0.2 10*3/uL (ref 0.0–0.5)
Eosinophils Relative: 3 %
HCT: 43.1 % (ref 39.0–52.0)
Hemoglobin: 15.5 g/dL (ref 13.0–17.0)
Immature Granulocytes: 0 %
Lymphocytes Relative: 28 %
Lymphs Abs: 2.2 10*3/uL (ref 0.7–4.0)
MCH: 27.9 pg (ref 26.0–34.0)
MCHC: 36 g/dL (ref 30.0–36.0)
MCV: 77.5 fL — ABNORMAL LOW (ref 80.0–100.0)
Monocytes Absolute: 0.6 10*3/uL (ref 0.1–1.0)
Monocytes Relative: 7 %
Neutro Abs: 4.8 10*3/uL (ref 1.7–7.7)
Neutrophils Relative %: 62 %
Platelets: 258 10*3/uL (ref 150–400)
RBC: 5.56 MIL/uL (ref 4.22–5.81)
RDW: 13.9 % (ref 11.5–15.5)
WBC: 7.9 10*3/uL (ref 4.0–10.5)
nRBC: 0 % (ref 0.0–0.2)

## 2022-11-18 LAB — COMPREHENSIVE METABOLIC PANEL
ALT: 24 U/L (ref 0–44)
AST: 31 U/L (ref 15–41)
Albumin: 4.3 g/dL (ref 3.5–5.0)
Alkaline Phosphatase: 65 U/L (ref 38–126)
Anion gap: 7 (ref 5–15)
BUN: 17 mg/dL (ref 6–20)
CO2: 23 mmol/L (ref 22–32)
Calcium: 8.6 mg/dL — ABNORMAL LOW (ref 8.9–10.3)
Chloride: 107 mmol/L (ref 98–111)
Creatinine, Ser: 1.19 mg/dL (ref 0.61–1.24)
GFR, Estimated: >60 mL/min (ref >60)
Glucose, Bld: 114 mg/dL — ABNORMAL HIGH (ref 70–99)
Potassium: 3.5 mmol/L (ref 3.5–5.1)
Sodium: 137 mmol/L (ref 135–145)
Total Bilirubin: 0.8 mg/dL (ref 0.3–1.2)
Total Protein: 8 g/dL (ref 6.5–8.1)

## 2022-11-18 LAB — TROPONIN I (HIGH SENSITIVITY)
Troponin I (High Sensitivity): 6 ng/L (ref <18)
Troponin I (High Sensitivity): 4 ng/L (ref <18)

## 2022-11-18 MED ORDER — ACETAMINOPHEN 325 MG PO TABS
650.0000 mg | ORAL_TABLET | Freq: Once | ORAL | Status: AC
  Administered 2022-11-18: 650 mg via ORAL
  Filled 2022-11-18: qty 2

## 2022-11-18 MED ORDER — BACLOFEN 10 MG PO TABS: 10.0000 mg | ORAL_TABLET | Freq: Three times a day (TID) | ORAL | 0 refills | Status: AC

## 2022-11-18 MED ORDER — MORPHINE SULFATE (PF) 4 MG/ML IV SOLN
4.0000 mg | Freq: Once | INTRAVENOUS | Status: AC
Start: 2022-11-18 — End: 2022-11-18
  Administered 2022-11-18: 4 mg via INTRAVENOUS
  Filled 2022-11-18: qty 1

## 2022-11-18 MED ORDER — ONDANSETRON 4 MG PO TBDP
4.0000 mg | ORAL_TABLET | Freq: Once | ORAL | Status: AC
  Administered 2022-11-18: 4 mg via ORAL
  Filled 2022-11-18: qty 1

## 2022-11-18 MED ORDER — SODIUM CHLORIDE 0.9 % IV BOLUS
1000.0000 mL | Freq: Once | INTRAVENOUS | Status: AC
  Administered 2022-11-18: 1000 mL via INTRAVENOUS

## 2022-11-18 MED ORDER — HYDROCODONE-ACETAMINOPHEN 5-325 MG PO TABS: 1.0000 | ORAL_TABLET | Freq: Four times a day (QID) | ORAL | 0 refills | Status: AC | PRN

## 2022-11-18 NOTE — ED Triage Notes (Signed)
Pt reports that he had a fall today from standing position and hit back of head, reports LOC. Denies blood thinners. Reports pain to back, neck, left arm and shoulder, and head.

## 2022-11-18 NOTE — ED Notes (Signed)
Pt provided discharge instructions and prescription information. Pt was given the opportunity to ask questions and questions were answered.   

## 2022-11-18 NOTE — ED Provider Triage Note (Signed)
Emergency Medicine Provider Triage Evaluation Note  Juan Ramos , a 57 y.o. male  was evaluated in triage.  Pt complains of fall today. Patient tripped over his leg and fell and hit his head on the patio. He cannot walk due to the pain. He thinks he may have passed out. No blood thinners.  Review of Systems  Positive: Pain in entire left arm, pain in hips, nausea and vomiting, blurry vision, cold sweats, headache, chest tightness, SOB Negative:   Physical Exam  There were no vitals taken for this visit. Gen:   Awake, no distress   Resp:  Normal effort  MSK:   TTP over cervical spine, TTP over left shoulder with reduced ROM, no TTP in left elbow, TTP over left wrist  Other:    Medical Decision Making  Medically screening exam initiated at 3:43 PM.  Appropriate orders placed.  Juan Ramos was informed that the remainder of the evaluation will be completed by another provider, this initial triage assessment does not replace that evaluation, and the importance of remaining in the ED until their evaluation is complete.   Cameron Ali, PA-C 11/18/22 1553

## 2022-11-18 NOTE — ED Provider Notes (Signed)
[facility] Provider Note    Event Date/Time   First MD Initiated Contact with Patient 11/18/22 1657     (approximate)   History   Fall and Head Injury   HPI  Juan Ramos is a 57 y.o. male with history of hypertension presents emergency department after a fall.  Patient states he felt like he lost consciousness.  His wife states she did not know that he had LOC.  States he had some chest pain prior to the fall.  Some vomiting.  Complaining of headache, back pain, chest pain.  Does not complain of shortness of breath or abdominal pain.      Physical Exam   Triage Vital Signs: ED Triage Vitals  Encounter Vitals Group     BP 11/18/22 1544 (!) 132/94     Systolic BP Percentile --      Diastolic BP Percentile --      Pulse Rate 11/18/22 1544 (!) 104     Resp 11/18/22 1544 19     Temp 11/18/22 1544 98.7 F (37.1 C)     Temp Source 11/18/22 1544 Oral     SpO2 11/18/22 1544 97 %     Weight 11/18/22 1553 300 lb (136.1 kg)     Height 11/18/22 1553 6\' 5"  (1.956 m)     Head Circumference --      Peak Flow --      Pain Score 11/18/22 1553 9     Pain Loc --      Pain Education --      Exclude from Growth Chart --     Most recent vital signs: Vitals:   11/18/22 1544  BP: (!) 132/94  Pulse: (!) 104  Resp: 19  Temp: 98.7 F (37.1 C)  SpO2: 97%     General: Awake, no distress.   CV:  Good peripheral perfusion.  Tachycardic and regular rhythm Resp:  Normal effort. Lungs CTA Abd:  No distention.   Other:      ED Results / Procedures / Treatments   Labs (all labs ordered are listed, but only abnormal results are displayed) Labs Reviewed  CBC WITH DIFFERENTIAL/PLATELET - Abnormal; Notable for the following components:      Result Value   MCV 77.5 (*)    All other components within normal limits  COMPREHENSIVE METABOLIC PANEL - Abnormal; Notable for the following components:   Glucose, Bld 114 (*)    Calcium 8.6 (*)    All other  components within normal limits  TROPONIN I (HIGH SENSITIVITY)  TROPONIN I (HIGH SENSITIVITY)     EKG  EKG shows tachycardia, see physician read   RADIOLOGY CT of the head, C-spine X-ray of the hips, left wrist, left shoulder, chest, left knee and lumbar spine    PROCEDURES:   Procedures   MEDICATIONS ORDERED IN ED: Medications  acetaminophen (TYLENOL) tablet 650 mg (650 mg Oral Given 11/18/22 1559)  ondansetron (ZOFRAN-ODT) disintegrating tablet 4 mg (4 mg Oral Given 11/18/22 1559)  sodium chloride 0.9 % bolus 1,000 mL (1,000 mLs Intravenous New Bag/Given 11/18/22 1832)  morphine (PF) 4 MG/ML injection 4 mg (4 mg Intravenous Given 11/18/22 1832)     IMPRESSION / MDM / ASSESSMENT AND PLAN / ED COURSE  I reviewed the triage vital signs and the nursing notes.  Differential diagnosis includes, but is not limited to, MI, subdural, SAH, fracture, contusion, strain  Patient's presentation is most consistent with acute illness / injury with system symptoms.   Patient does appear to look stable.  However do lab work for MI due to the chest pain and vomiting.  CT of the head and cervical spine, I did independently review and interpret the radiologist reading as being negative for any acute abnormality  xray of the left wrist, left shoulder, chest and hips independently reviewed interpreted by me as being negative for any acute abnormality   Labs are reassuring, will get second troponin due to the patient's episode of chest pain along with vomiting  In addition to the x-rays that were ordered from triage will order a x-ray of the left knee and lumbar spine  Patient was given normal saline 1 L IV due to the tachycardia, and headache, morphine 4 mg IV, patient's already had Zofran 4 mg ODT from triage  3 of the left knee, lumbar spine and left elbow independently reviewed interpreted by me as being negative for any acute abnormality  Patient did  have pain relief with medications.  Explained to him he has had a lot of contusions.  Troponins are normal.  Do not feel that he had an MI.  He is to follow-up with his regular doctor at the [place].  Return emergency department if worsening.  Patient is in agreement treatment plan.  Discharged in stable condition with a prescription for Vicodin and baclofen.  He is to also take ibuprofen.   FINAL CLINICAL IMPRESSION(S) / ED DIAGNOSES   Final diagnoses:  Fall, initial encounter  Injury of head, initial encounter  Other chest pain  Multiple contusions     Rx / DC Orders   ED Discharge Orders          Ordered    HYDROcodone-acetaminophen (NORCO/VICODIN) 5-325 MG tablet  Every 6 hours PRN        11/18/22 1923    baclofen (LIORESAL) 10 MG tablet  3 times daily        11/18/22 1923             Note:  This document was prepared using Dragon voice recognition software and may include unintentional dictation errors.    Faythe Ghee, PA-C 11/18/22 Barnie Mort    Corena Herter, MD 11/19/22 [phone]

## 2022-11-19 DIAGNOSIS — Z419 Encounter for procedure for purposes other than remedying health state, unspecified: Secondary | ICD-10-CM | POA: Diagnosis not present

## 2022-12-19 DIAGNOSIS — Z419 Encounter for procedure for purposes other than remedying health state, unspecified: Secondary | ICD-10-CM | POA: Diagnosis not present

## 2022-12-23 LAB — LAB REPORT - SCANNED: A1c: 5.6

## 2022-12-25 IMAGING — CT CT ANGIO CHEST-ABD-PELV FOR DISSECTION W/ AND WO/W CM
2 of 7 series · 14 of 46 positions shown, 16 images · IV contrast (APPLIED)
Comparison: CT chest 05/02/2019.  CT abdomen and pelvis 11/25/2020

CLINICAL DATA: Acute aortic syndrome suspected. Chest pain with
history of hypertension.

EXAM:
CT ANGIOGRAPHY CHEST, ABDOMEN AND PELVIS
TECHNIQUE: Non-contrast CT of the chest was initially obtained.

[Series 6: arterial · axial · arterial · 0.87mm/px · z∈[-1064,-394]mm · 11 of 377 slices shown, 13 images]
[im 21/377  soft-tissue]
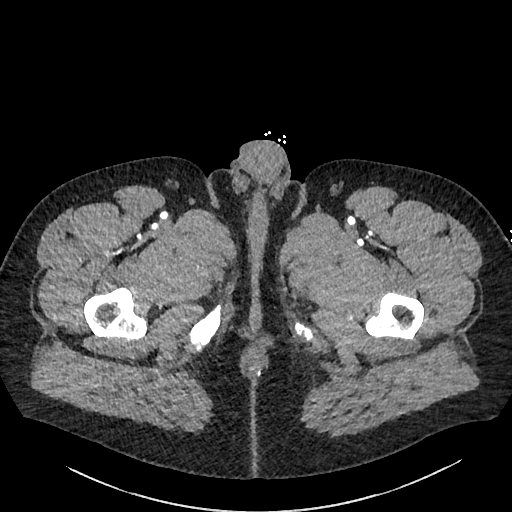
[im 21/377  bone]
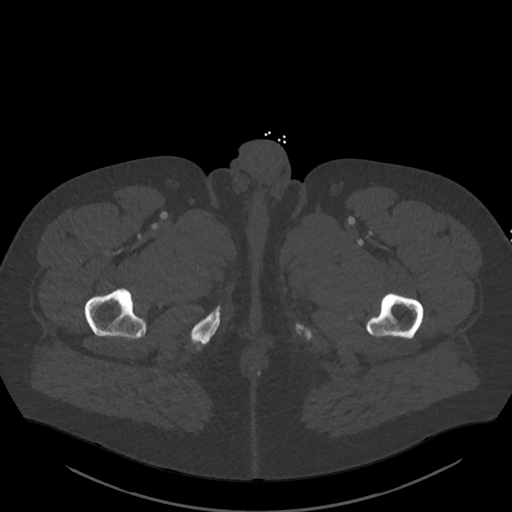
[im 63/377  soft-tissue]
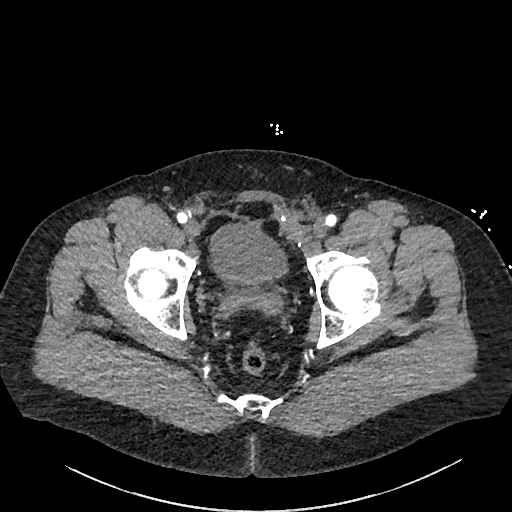
[im 84/377  soft-tissue]
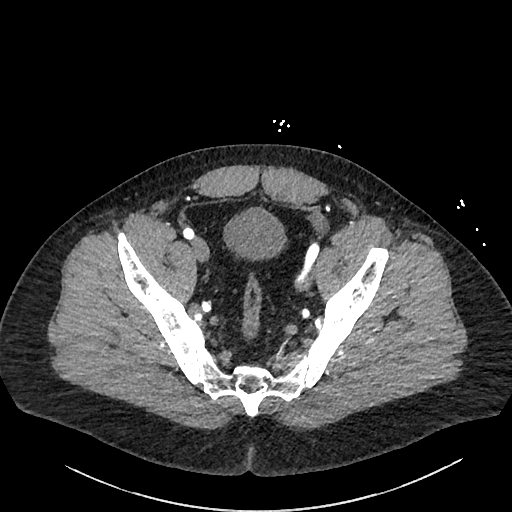
[im 126/377  soft-tissue]
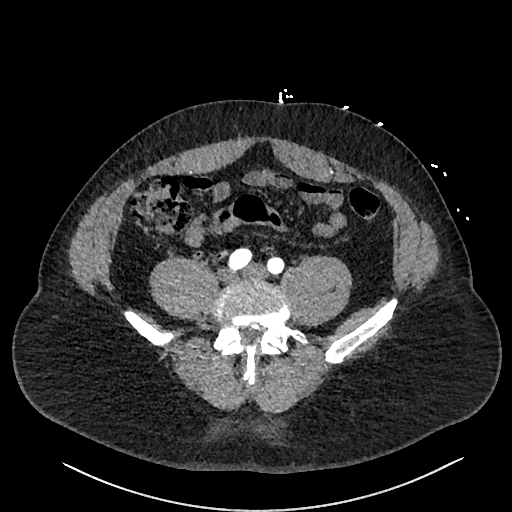
[im 147/377  soft-tissue]
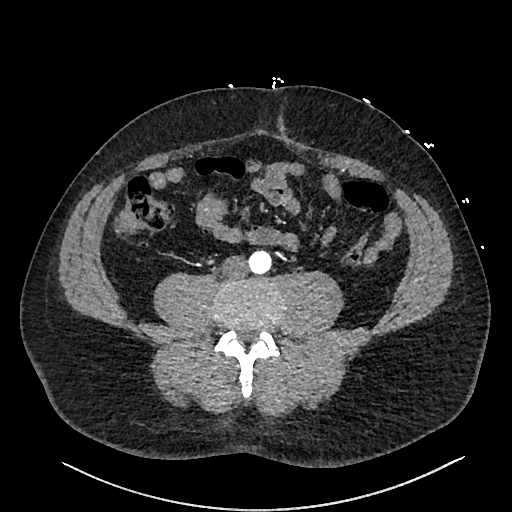
[im 189/377  soft-tissue]
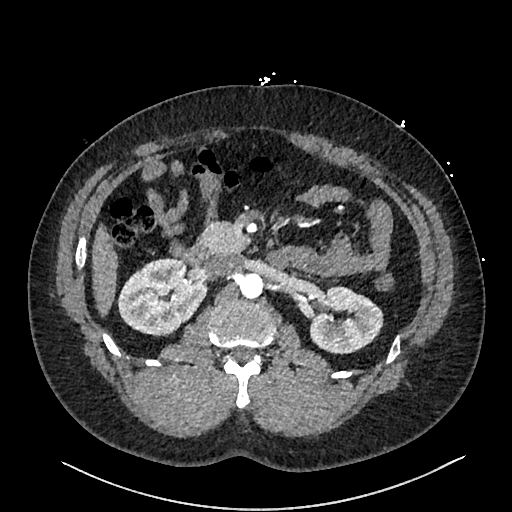
[im 230/377  soft-tissue]
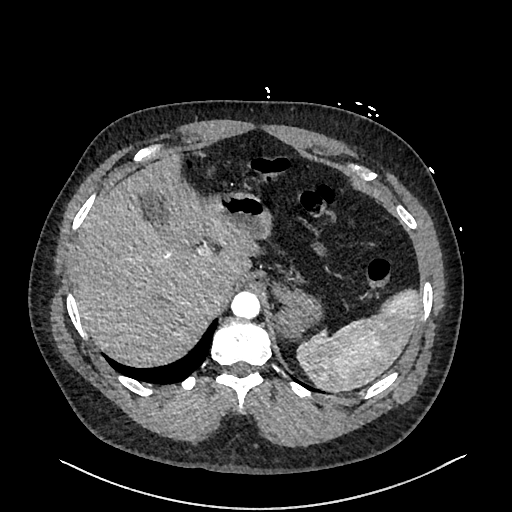
[im 251/377  soft-tissue]
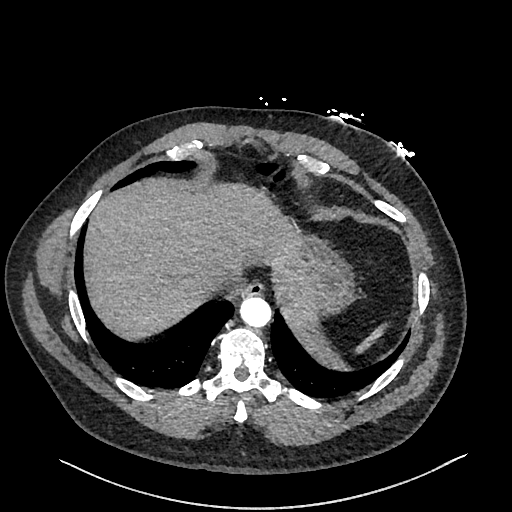
[im 293/377  soft-tissue]
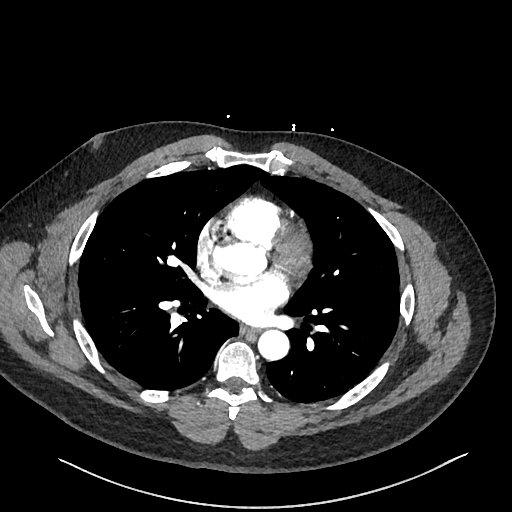
[im 293/377  bone]
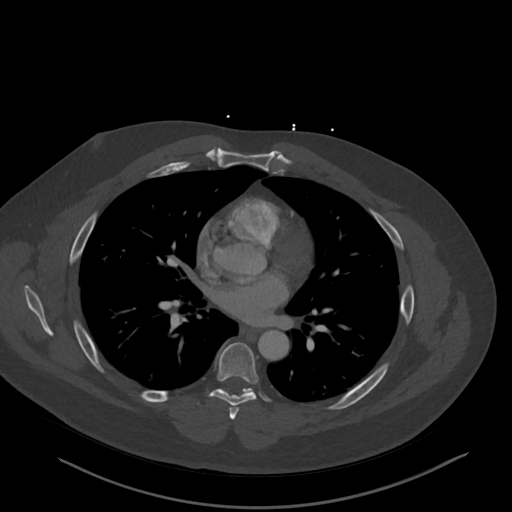
[im 314/377  soft-tissue]
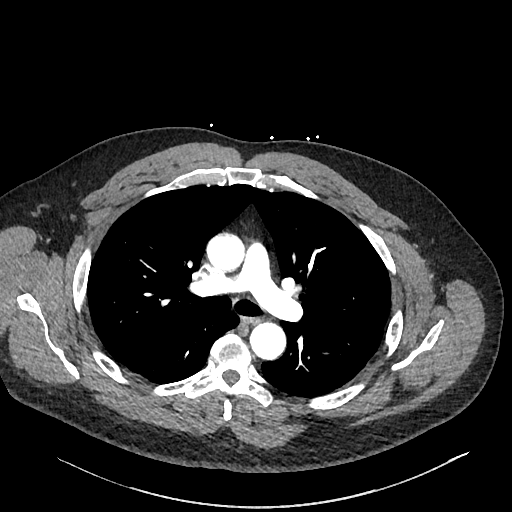
[im 356/377  soft-tissue]
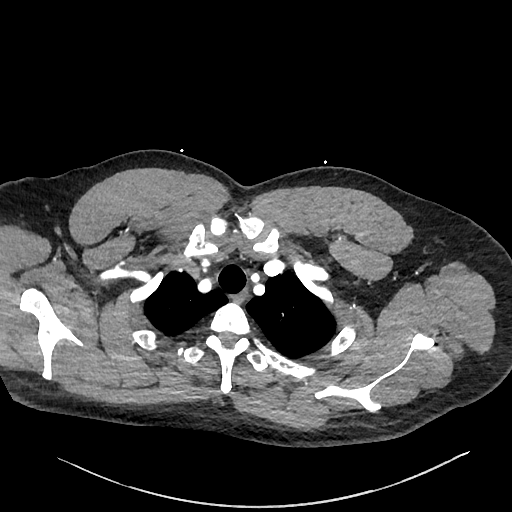

[Series 9: cor · coronal · 1.02mm/px · 3 of 163 slices shown]
[im 41/163  soft-tissue]
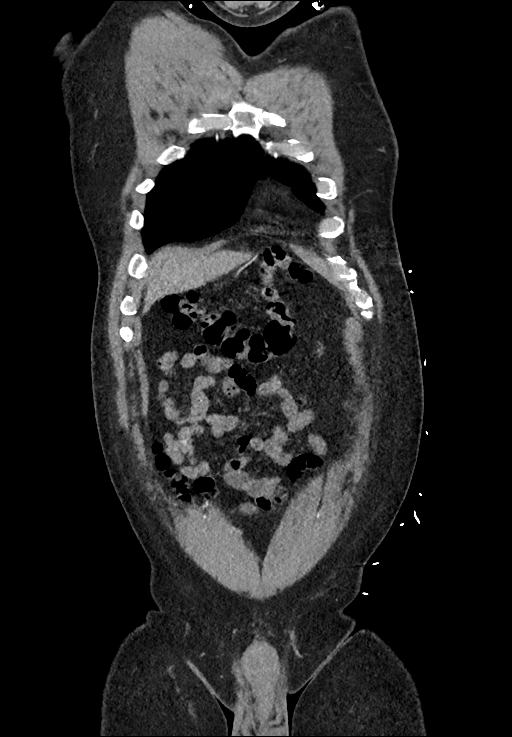
[im 82/163  soft-tissue]
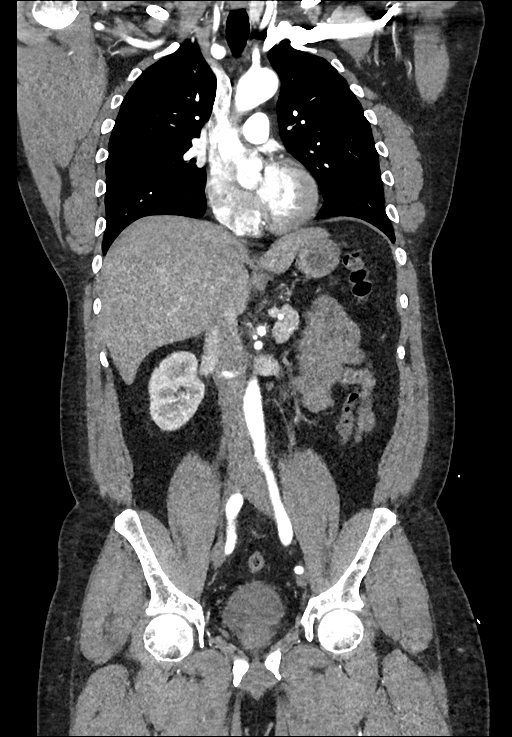
[im 122/163  soft-tissue]
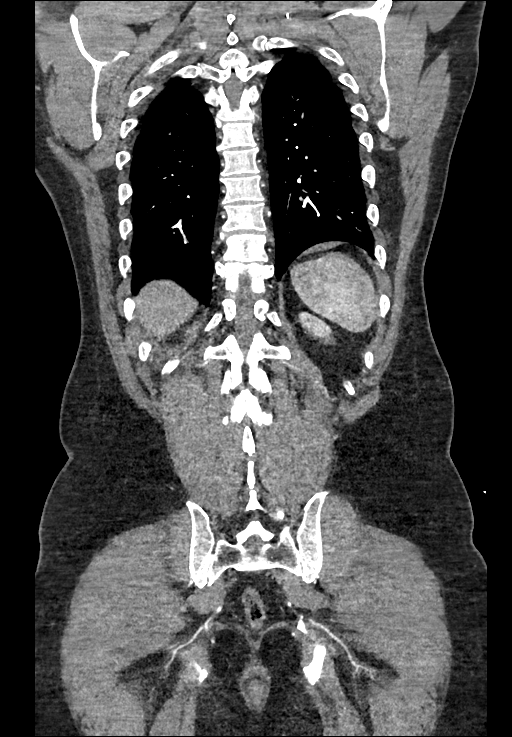

[14 of 46 positions shown; findings below may reference images not displayed]

Multidetector CT imaging through the chest, abdomen and pelvis was
performed using the standard protocol during bolus administration of
intravenous contrast. Multiplanar reconstructed images and MIPs were
obtained and reviewed to evaluate the vascular anatomy.

RADIATION DOSE REDUCTION: This exam was performed according to the
departmental dose-optimization program which includes automated
exposure control, adjustment of the mA and/or kV according to
patient size and/or use of iterative reconstruction technique.

CONTRAST:  125mL OMNIPAQUE IOHEXOL 350 MG/ML SOLN
FINDINGS: CTA CHEST FINDINGS

Cardiovascular: Unenhanced images of the chest demonstrate no
significant vascular calcification. No evidence of intramural
hematoma. Images obtained during arterial phase after contrast
administration demonstrate normal caliber thoracic aorta. No
aneurysm or dissection. Great vessel origins are patent. Central
pulmonary arteries are patent. No evidence of significant pulmonary
embolus. Normal heart size. No pericardial effusions.

Mediastinum/Nodes: Esophagus is decompressed. No significant
lymphadenopathy. Thyroid gland is unremarkable.

Lungs/Pleura: Lungs are clear. No pleural effusion or pneumothorax.

Musculoskeletal: No chest wall abnormality. No acute or significant
osseous findings.

Review of the MIP images confirms the above findings.

CTA ABDOMEN AND PELVIS FINDINGS

VASCULAR

Aorta: Normal caliber aorta without aneurysm, dissection, vasculitis
or significant stenosis.

Celiac: Patent without evidence of aneurysm, dissection, vasculitis
or significant stenosis.

SMA: Patent without evidence of aneurysm, dissection, vasculitis or
significant stenosis.

Renals: Both renal arteries are patent without evidence of aneurysm,
dissection, vasculitis, fibromuscular dysplasia or significant
stenosis.

IMA: Patent without evidence of aneurysm, dissection, vasculitis or
significant stenosis.

Inflow: Patent without evidence of aneurysm, dissection, vasculitis
or significant stenosis.

Veins: No obvious venous abnormality within the limitations of this
arterial phase study.

Review of the MIP images confirms the above findings.

NON-VASCULAR

Hepatobiliary: No focal liver abnormality is seen. No gallstones,
gallbladder wall thickening, or biliary dilatation.

Pancreas: Unremarkable. No pancreatic ductal dilatation or
surrounding inflammatory changes.

Spleen: Normal in size without focal abnormality.

Adrenals/Urinary Tract: Adrenal glands are unremarkable. Kidneys are
normal, without renal calculi, focal lesion, or hydronephrosis.
Bladder is unremarkable.

Stomach/Bowel: Stomach is within normal limits. Appendix appears
normal. No evidence of bowel wall thickening, distention, or
inflammatory changes.

Lymphatic: No significant lymphadenopathy.

Reproductive: Prostate is unremarkable.

Other: No free air or free fluid in the abdomen. Abdominal wall
musculature appears intact.

Musculoskeletal: No acute or significant osseous findings.

Review of the MIP images confirms the above findings.
IMPRESSION: 1. No evidence of aneurysm or dissection involving the thoracic or
abdominal aorta.
2. No evidence of significant pulmonary embolus.
3. Lungs are clear.
4. No acute process demonstrated in the abdomen or pelvis.

## 2023-01-17 IMAGING — CT CT ABD-PELV W/ CM
2 of 5 series · 16 of 46 positions shown, 18 images · IV contrast (APPLIED)
Comparison: CT April 01, 2021 and November 25, 2020

CLINICAL DATA: Right-sided abdominal pain x1 month. History of
hernia repair.

EXAM:
CT ABDOMEN AND PELVIS WITH CONTRAST
TECHNIQUE: Multidetector CT imaging of the abdomen and pelvis was performed
using the standard protocol following bolus administration of
intravenous contrast.

[Series 3: abd/ pelvis 5.0 i30f 2 · axial · 0.94mm/px · z∈[+826,+1341]mm · 13 of 117 slices shown, 15 images]
[im 7/117  soft-tissue]
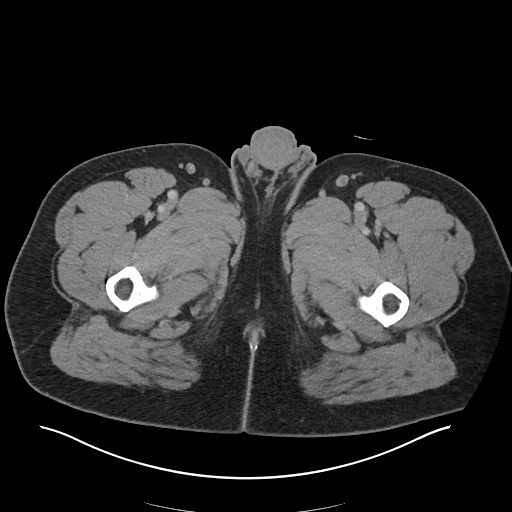
[im 7/117  bone]
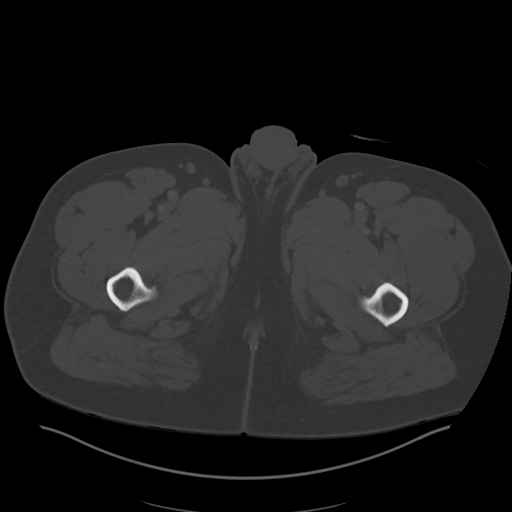
[im 19/117  soft-tissue]
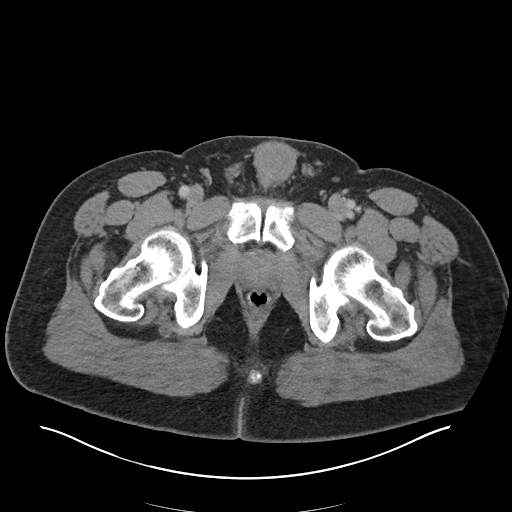
[im 25/117  soft-tissue]
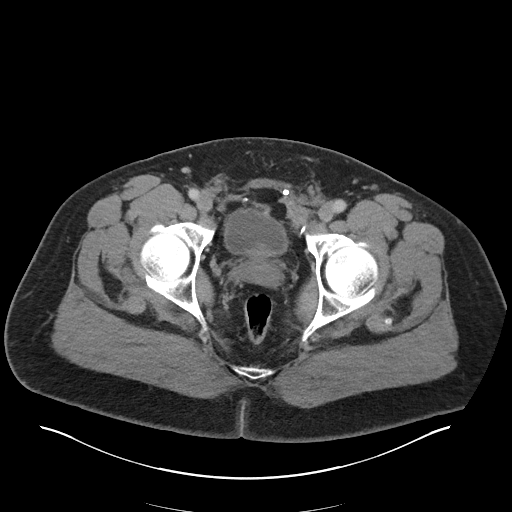
[im 31/117  soft-tissue]
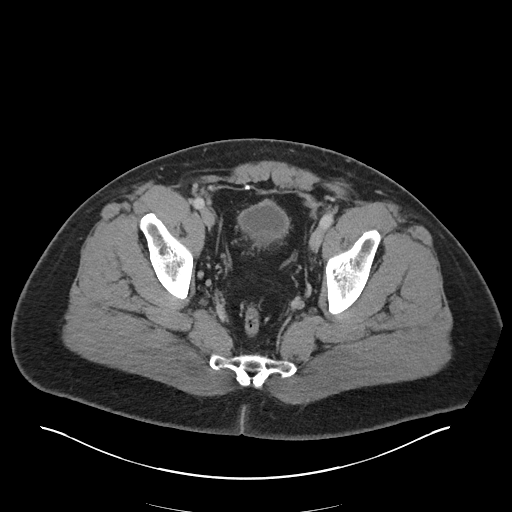
[im 43/117  soft-tissue]
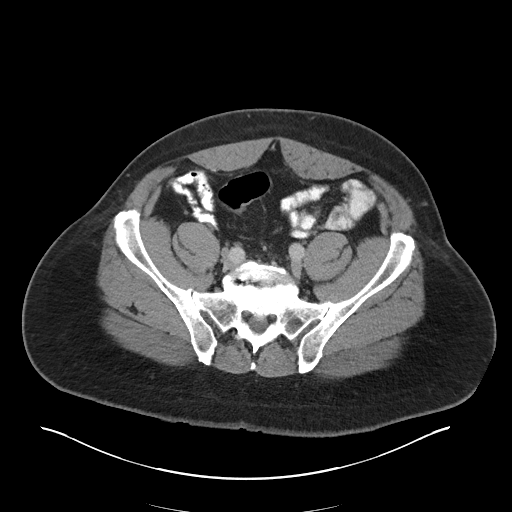
[im 49/117  soft-tissue]
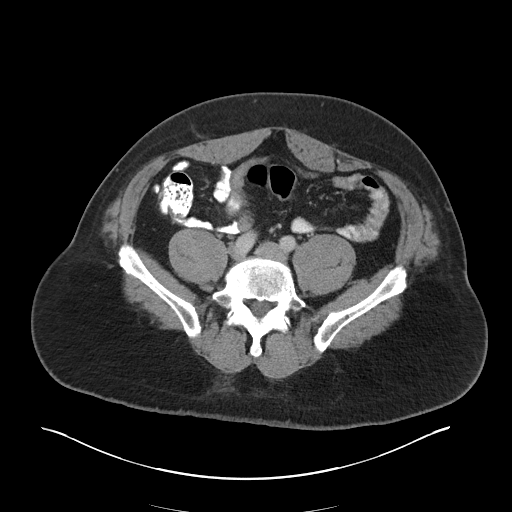
[im 62/117  soft-tissue]
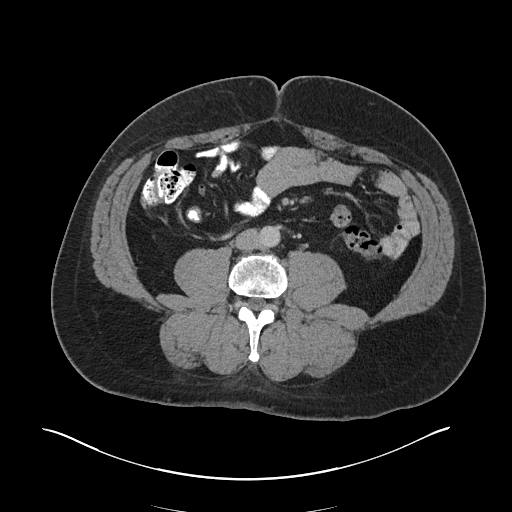
[im 68/117  soft-tissue]
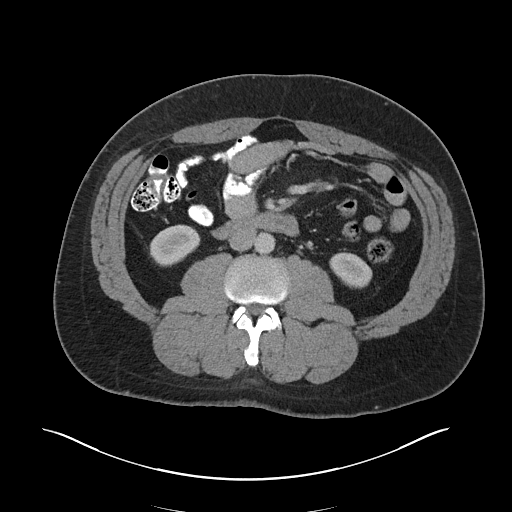
[im 74/117  soft-tissue]
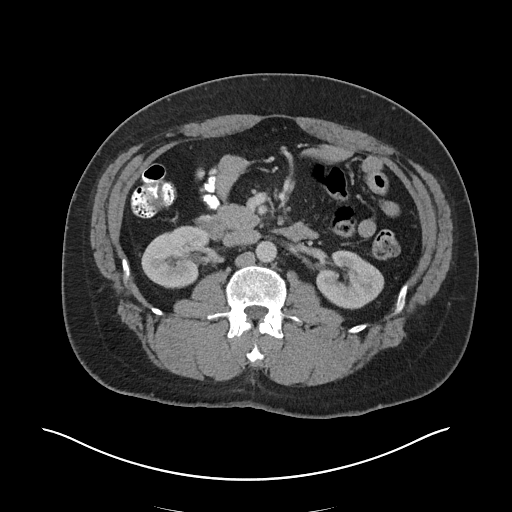
[im 74/117  bone]
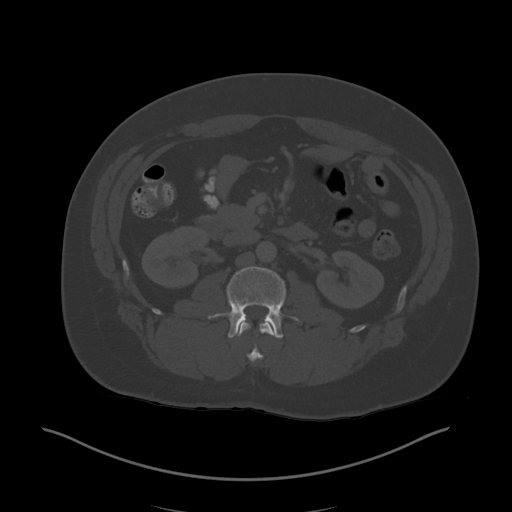
[im 86/117  soft-tissue]
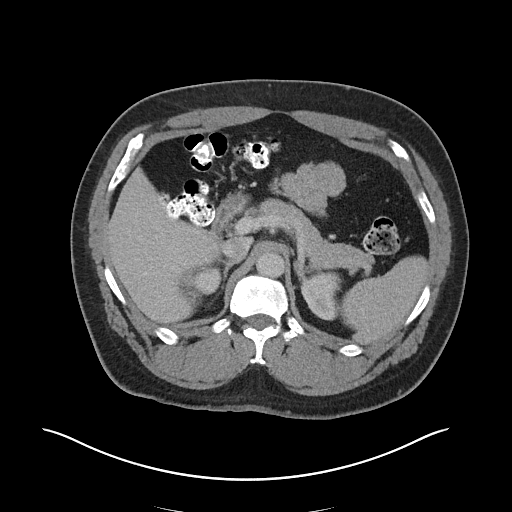
[im 92/117  soft-tissue]
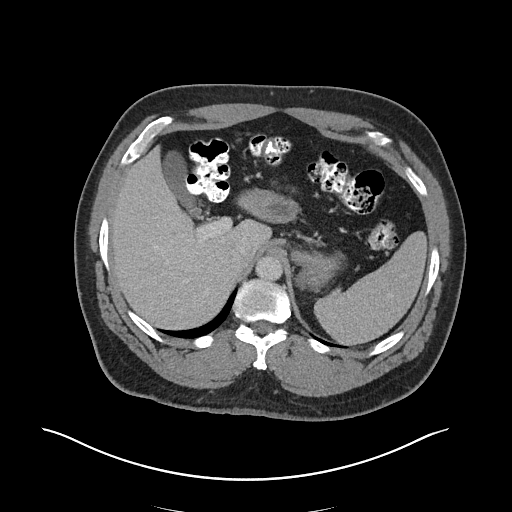
[im 98/117  soft-tissue]
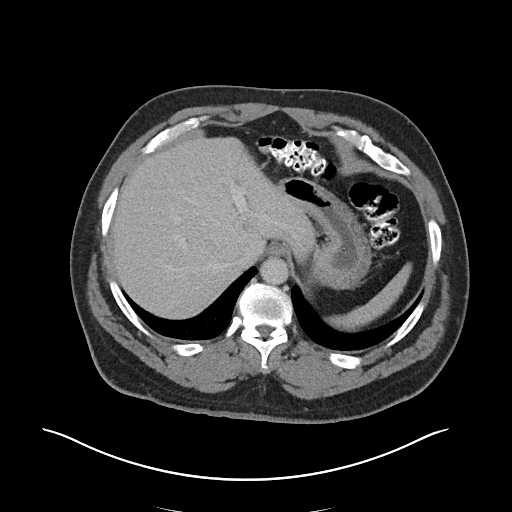
[im 110/117  soft-tissue]
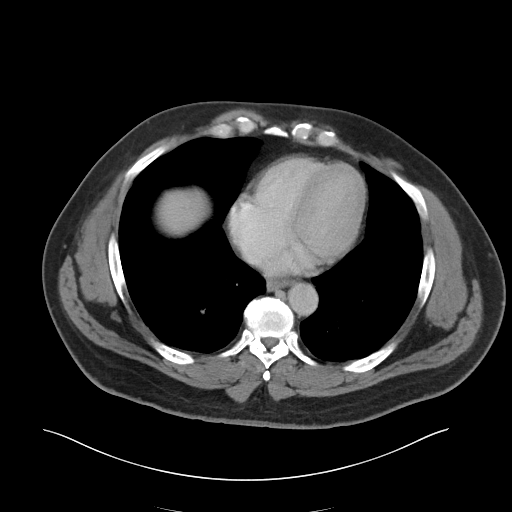

[Series 7: coronal soft tissue · coronal · 0.99mm/px · 3 of 111 slices shown]
[im 37/111  soft-tissue]
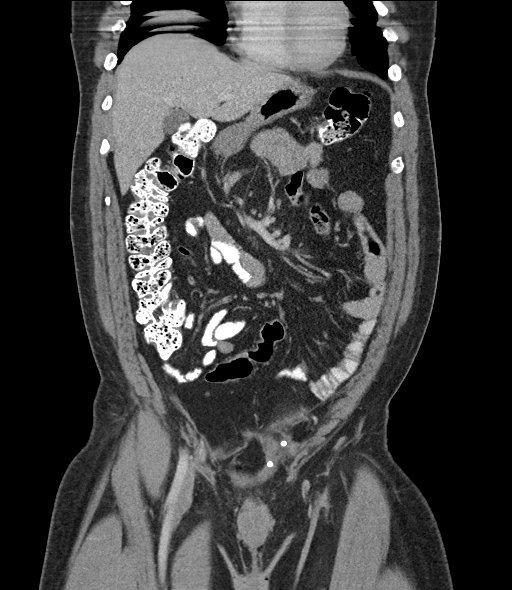
[im 49/111  soft-tissue]
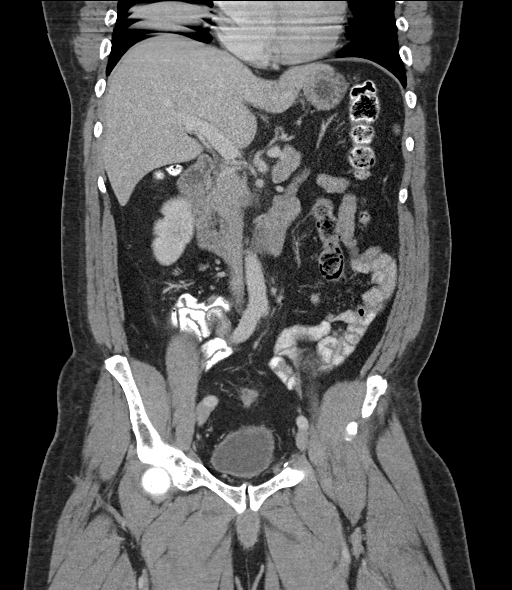
[im 62/111  soft-tissue]
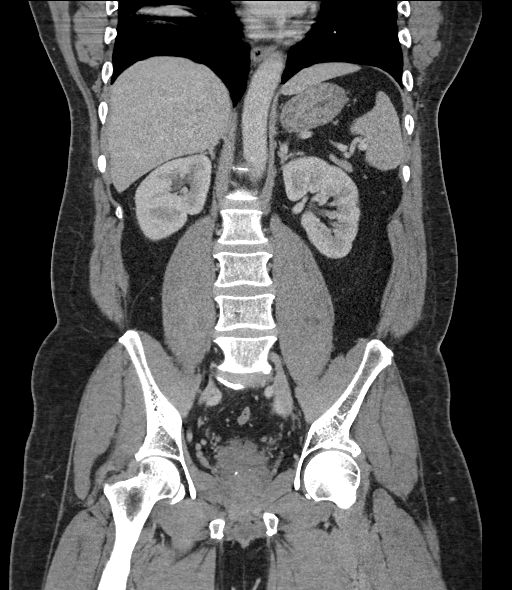

[16 of 46 positions shown; findings below may reference images not displayed]

RADIATION DOSE REDUCTION: This exam was performed according to the
departmental dose-optimization program which includes automated
exposure control, adjustment of the mA and/or kV according to
patient size and/or use of iterative reconstruction technique.

CONTRAST:  100mL OMNIPAQUE IOHEXOL 300 MG/ML  SOLN
FINDINGS: Lower chest: Motion degraded examination of the lung bases.

Hepatobiliary: Scattered hypodense hepatic lesions are technically
too small to accurately characterize but similar dating back over
multiple prior studies to April 03, 2016, likely reflecting a benign
etiology such as cysts. Gallbladder is unremarkable. No biliary
ductal dilation.

Pancreas: No pancreatic ductal dilation or evidence of acute
inflammation.

Spleen: No splenomegaly or focal splenic lesion.

Adrenals/Urinary Tract: Bilateral adrenal glands appear normal. No
hydronephrosis. Kidneys demonstrate symmetric enhancement and
excretion of contrast material. Urinary bladder is unremarkable for
degree of distension.

Stomach/Bowel: Radiopaque enteric contrast material traverses the
splenic flexure. Stomach is unremarkable for degree of distension.
No pathologic dilation of small or large bowel. The appendix and
terminal ileum appear normal. No evidence of acute bowel
inflammation.

Vascular/Lymphatic: Normal caliber abdominal aorta. No
pathologically enlarged abdominal or pelvic lymph nodes.

Reproductive: Dystrophic prostatic calcifications.

Other: Prior bilateral inguinal hernia repairs without evidence of
recurrence or complication.

Musculoskeletal: L5-S1 discogenic disease. No acute osseous
abnormality.
IMPRESSION: 1. No acute abnormality within the abdomen or pelvis.
2. Prior bilateral inguinal hernia repairs without evidence of
recurrence or complication.

## 2023-01-19 DIAGNOSIS — Z419 Encounter for procedure for purposes other than remedying health state, unspecified: Secondary | ICD-10-CM | POA: Diagnosis not present

## 2023-02-19 DIAGNOSIS — Z419 Encounter for procedure for purposes other than remedying health state, unspecified: Secondary | ICD-10-CM | POA: Diagnosis not present

## 2023-03-17 ENCOUNTER — Ambulatory Visit (INDEPENDENT_AMBULATORY_CARE_PROVIDER_SITE_OTHER): Payer: Non-veteran care | Admitting: Gastroenterology

## 2023-03-17 VITALS — BP 127/97 | HR 101 | Temp 98.7°F | Ht 78.0 in | Wt 320.0 lb

## 2023-03-17 DIAGNOSIS — K5900 Constipation, unspecified: Secondary | ICD-10-CM

## 2023-03-17 DIAGNOSIS — R1013 Epigastric pain: Secondary | ICD-10-CM

## 2023-03-17 DIAGNOSIS — K581 Irritable bowel syndrome with constipation: Secondary | ICD-10-CM

## 2023-03-17 MED ORDER — OMEPRAZOLE 40 MG PO CPDR: 40.0000 mg | DELAYED_RELEASE_CAPSULE | Freq: Every day | ORAL | 0 refills | Status: AC

## 2023-03-17 NOTE — Patient Instructions (Signed)
 Please let us know if Trulance is helping you so we could send a prescription to your pharmacy.

## 2023-03-17 NOTE — Progress Notes (Signed)
 Wyline Mood MD, MRCP(U.K) [address]  [address]  [place], [place] [address]  Main: [phone]  Fax: [phone]   Gastroenterology Consultation  Referring Provider:     [facility] Primary Care Physician:  [facility] Primary Gastroenterologist:  Dr. Wyline Mood  Reason for Consultation:     Constipation         HPI:   Juan Ramos is a 58 y.o. y/o male referred for consultation & management  by [facility]. he was 15 minutes late for a 30-minute appointment  Has been referred for Constipation . In 2022 an attempt at colonoscopy was discussed but he had bradycardia and it is felt that he need a permanent pacemaker hence a procedure was canceled.  No recent labs.  No recent abdominal imaging.  He says that he can go up to a week without a bowel movement feels bloated gassy has abdominal discomfort.  Has noticed some blood in the stool.  No prior colonoscopy.  No family history of colon cancer.  Denies any unintentional blood loss.  He takes meloxicam for his back pain.  Denies any other NSAID use.  He complains of reflux.  He also takes hydrocodone.  Not tried any laxatives.  Past Medical History:  Diagnosis Date   Hypertension     Past Surgical History:  Procedure Laterality Date   HERNIA REPAIR     SHOULDER SURGERY      Prior to Admission medications   Medication Sig Start Date End Date Taking? Authorizing Provider  amLODipine (NORVASC) 10 MG tablet Take 10 mg by mouth daily. 12/20/20   [provider]  Aspirin-Calcium Carbonate 81-777 MG TABS Take by mouth.    [provider]  atenolol (TENORMIN) 25 MG tablet Take by mouth.    [provider]  diclofenac sodium (VOLTAREN) 1 % GEL Place onto the skin.    [provider]  dicyclomine (BENTYL) 10 MG capsule Take by mouth.    [provider]  DULoxetine (CYMBALTA) 30 MG capsule Take by mouth.    [provider]   HYDROcodone-acetaminophen (NORCO/VICODIN) 5-325 MG tablet Take 1 tablet by mouth every 6 (six) hours as needed for moderate pain (pain score 4-6). 11/18/22   Fisher, Roselyn Bering, PA-C  loratadine (CLARITIN) 10 MG tablet Take by mouth.    [provider]  losartan (COZAAR) 50 MG tablet Take 25 mg by mouth daily. 12/20/20   [provider]  naproxen (NAPROSYN) 500 MG tablet Take by mouth.    [provider]  oxybutynin (DITROPAN-XL) 10 MG 24 hr tablet Take by mouth. 11/15/17   [provider]  pantoprazole (PROTONIX) 40 MG tablet Take by mouth.    [provider]  polyethylene glycol (MIRALAX / GLYCOLAX) packet Take by mouth.    [provider]  sildenafil (VIAGRA) 100 MG tablet Take by mouth.    [provider]  SUMAtriptan (IMITREX) 100 MG tablet Take by mouth.    [provider]  SUMAtriptan 6 MG/0.5ML SOAJ Inject into the skin.    [provider]  topiramate (TOPAMAX) 25 MG capsule Take by mouth.    [provider]  venlafaxine (EFFEXOR) 75 MG tablet Take by mouth.    [provider]    Family History  Problem Relation Age of Onset   Mental illness Neg Hx      Social History   Tobacco Use   Smoking status: Never   Smokeless tobacco:  Never  Substance Use Topics   Alcohol use: No   Drug use: No    Allergies as of 03/17/2023 - Review Complete 03/17/2023  Allergen Reaction Noted   Shellfish allergy  04/03/2016    Review of Systems:    All systems reviewed and negative except where noted in HPI.   Physical Exam:  BP (!) 127/97   Pulse (!) 101   Temp 98.7 F (37.1 C) (Oral)   Ht 6\' 6"  (1.981 m)   Wt (!) 320 lb (145.2 kg)   BMI 36.98 kg/m  No LMP for male patient. Psych:  Alert and cooperative. Normal mood and affect. General:   Alert,  Well-developed, well-nourished, pleasant and cooperative in NAD Head:  Normocephalic and atraumatic. Eyes:  Sclera clear, no icterus.    Conjunctiva pink. Ears:  Normal auditory acuity. Abdomen:  Normal bowel sounds.  No bruits.  Soft, non-tender and non-distended without masses, hepatosplenomegaly or hernias noted.  No guarding or rebound tenderness.    Neurologic:  Alert and oriented x3;  grossly normal neurologically. Psych:  Alert and cooperative. Normal mood and affect.  Imaging Studies: No results found.  Assessment and Plan:   Juan Ramos is a 58 y.o. y/o male has been referred for Constipation .  Can go up to a week with a bowel movement not tried any agents recently.  No prior colonoscopy.  Prior attempt was unsuccessful due to bradycardia procedure was not performed.  He also has symptoms of abdominal distention and bloating particularly when he has not had a bowel movement.  Some blood in the stool.Marland Kitchen  He also has symptoms of abdominal distention and bloating particularly when he has not had a bowel movement.  I will plan get cardiac clearance to determine if we can proceed with a colonoscopy.  In the interim will commence on Trulance since he says MiraLAX did not work in the past samples will be provided for a week I will commence him on omeprazole 40 mg once a day for dyspeptic symptoms.  Advised to stop all NSAID use.  If no better at next visit we will plan to perform EGD along with colonoscopy provided to get clearance  Follow up in 6 weeks  Dr Wyline Mood MD,MRCP(U.K)

## 2023-03-19 DIAGNOSIS — Z419 Encounter for procedure for purposes other than remedying health state, unspecified: Secondary | ICD-10-CM | POA: Diagnosis not present

## 2023-04-26 ENCOUNTER — Telehealth: Payer: Self-pay | Admitting: Gastroenterology

## 2023-04-26 NOTE — Telephone Encounter (Signed)
 The patient called in to speak with Ginger about his appointment.

## 2023-04-28 LAB — H. PYLORI BREATH TEST: H pylori Breath Test: NEGATIVE

## 2023-04-28 LAB — H. PYLORI BREATH COLLECTION

## 2023-04-29 NOTE — Telephone Encounter (Signed)
 The patient called inquiring about his appointment, stating that he believed he was scheduled to see Dr. Tobi Bastos today. I informed him that Dr. Tobi Bastos does not work on Fridays and that his appointment is scheduled for 05/05/2023. The patient stated that it must have been a rescheduled appointment and requested to speak with Ginger

## 2023-04-30 DIAGNOSIS — Z419 Encounter for procedure for purposes other than remedying health state, unspecified: Secondary | ICD-10-CM | POA: Diagnosis not present

## 2023-05-02 ENCOUNTER — Other Ambulatory Visit: Payer: Self-pay

## 2023-05-02 DIAGNOSIS — H903 Sensorineural hearing loss, bilateral: Secondary | ICD-10-CM

## 2023-05-02 HISTORY — DX: Sensorineural hearing loss, bilateral: H90.3

## 2023-05-03 ENCOUNTER — Ambulatory Visit (INDEPENDENT_AMBULATORY_CARE_PROVIDER_SITE_OTHER): Admitting: Gastroenterology

## 2023-05-03 ENCOUNTER — Encounter: Payer: Self-pay | Admitting: Gastroenterology

## 2023-05-03 VITALS — BP 146/93 | HR 75 | Temp 97.9°F | Ht 78.0 in | Wt 320.0 lb

## 2023-05-03 DIAGNOSIS — K581 Irritable bowel syndrome with constipation: Secondary | ICD-10-CM

## 2023-05-03 MED ORDER — LACTULOSE 10 GM/15ML PO SOLN: 10.0000 g | Freq: Four times a day (QID) | ORAL | 11 refills | Status: AC

## 2023-05-03 NOTE — Patient Instructions (Signed)
 Once we get your cardiac clearance, we will call you to schedule your colonoscopy. It will be helpful if you could call your primary doctor from the [place] to give us  cardiac clearance. Thank you for your help.

## 2023-05-03 NOTE — Progress Notes (Unsigned)
 Wyline Mood MD, MRCP(U.K) [address]  [address]  [place], [place] [address]  Main: [phone]  Fax: [phone]   Primary Care Physician: Patient, No Pcp Per  Primary Gastroenterologist:  Dr. Wyline Mood   Chief Complaint  Patient presents with   Follow-up    IBS    HPI: Juan Ramos is a 58 y.o. male was initially seen on 03/17/2023 when he was referred for constipation from the [place].. In 2022 an attempt at colonoscopy was discussed but he had bradycardia and it is felt that he need a permanent pacemaker hence a procedure was canceled.   No recent labs.  No recent abdominal imaging.   He says that he can go up to a week without a bowel movement feels bloated gassy has abdominal discomfort.  Has noticed some blood in the stool.  No prior colonoscopy.  No family history of colon cancer.  Denies any unintentional blood loss.  He takes meloxicam for his back pain.  Denies any other NSAID use.  He complains of reflux.  He also takes hydrocodone.  Not tried any laxatives.   At his last visit I explained that he needed cardiac clearance for his colonoscopy due to incomplete procedure previously and he had some blood in the stool.  I    Since his last visit he says that the constipation has not done well with the Trulance he would like a different medication.  He has not yet obtained cardiac clearance for his colonoscopy.  No other upper GI symptoms that he had previously.  Current Outpatient Medications  Medication Sig Dispense Refill   amLODipine (NORVASC) 10 MG tablet Take 10 mg by mouth daily.     Aspirin-Calcium Carbonate 81-777 MG TABS Take by mouth.     atenolol (TENORMIN) 25 MG tablet Take by mouth.     diclofenac sodium (VOLTAREN) 1 % GEL Place onto the skin.     dicyclomine (BENTYL) 10 MG capsule Take by mouth.     DULoxetine (CYMBALTA) 30 MG capsule Take by mouth.     HYDROcodone-acetaminophen (NORCO/VICODIN) 5-325 MG tablet Take 1 tablet by mouth every 6 (six)  hours as needed for moderate pain (pain score 4-6). 8 tablet 0   loratadine (CLARITIN) 10 MG tablet Take by mouth.     losartan (COZAAR) 50 MG tablet Take 25 mg by mouth daily.     naproxen (NAPROSYN) 500 MG tablet Take by mouth.     omeprazole (PRILOSEC) 40 MG capsule Take 1 capsule (40 mg total) by mouth daily. 90 capsule 0   oxybutynin (DITROPAN-XL) 10 MG 24 hr tablet Take by mouth.     polyethylene glycol (MIRALAX / GLYCOLAX) packet Take by mouth.     sildenafil (VIAGRA) 100 MG tablet Take by mouth.     SUMAtriptan (IMITREX) 100 MG tablet Take by mouth.     SUMAtriptan 6 MG/0.5ML SOAJ Inject into the skin.     topiramate (TOPAMAX) 25 MG capsule Take by mouth.     venlafaxine (EFFEXOR) 75 MG tablet Take by mouth.     No current facility-administered medications for this visit.    Allergies as of 05/03/2023 - Review Complete 03/17/2023  Allergen Reaction Noted   Iodinated contrast media Hives 09/26/2017   Shellfish allergy  04/03/2016        ROS:  General: Negative for anorexia, weight loss, fever, chills, fatigue, weakness. ENT: Negative for hoarseness, difficulty swallowing , nasal congestion. CV: Negative for chest pain, angina, palpitations,  dyspnea on exertion, peripheral edema.  Respiratory: Negative for dyspnea at rest, dyspnea on exertion, cough, sputum, wheezing.  GI: See history of present illness. GU:  Negative for dysuria, hematuria, urinary incontinence, urinary frequency, nocturnal urination.  Endo: Negative for unusual weight change.    Physical Examination:   There were no vitals taken for this visit.  General: Well-nourished, well-developed in no acute distress.  Eyes: No icterus. Conjunctivae pink. Neuro: Alert and oriented x 3.  Grossly intact. Skin: Warm and dry, no jaundice.   Psych: Alert and cooperative, normal mood and affect.   Imaging Studies: No results found.  Assessment and Plan:   Juan Ramos is a 58 y.o. y/o male here to follow  for severe constipation and blood in the stool.  At his last visit we recommended a colonoscopy and possibly an EGD as well.  He required cardiac clearance since he had bradycardia during the last attempt of his colonoscopy back in 2022.  We have not yet obtained cardiac clearance and hence cannot proceed with the procedure.  Plan 1.  Obtain cardiac clearance before proceeding with colonoscopy 2.  For constipation stop Trulance and commence on lactulose 15 cc 4 times daily and we can titrated to desired effect.  I have discussed alternative options, risks & benefits,  which include, but are not limited to, bleeding, infection, perforation,respiratory complication & drug reaction.  The patient agrees with this plan & written consent will be obtained.     Dr Luke Salaam  MD,MRCP [facility]) Follow up in as needed

## 2023-05-05 ENCOUNTER — Ambulatory Visit: Payer: Non-veteran care | Admitting: Gastroenterology

## 2023-05-11 ENCOUNTER — Telehealth: Payer: Self-pay

## 2023-05-11 NOTE — Telephone Encounter (Signed)
 PT REQUESTING CALL BACK WHEN SCHEDULE OPENS

## 2023-05-12 ENCOUNTER — Telehealth: Payer: Self-pay

## 2023-05-12 NOTE — Telephone Encounter (Signed)
 Called patient to let him know that Dr. Leamon Pringle had sent a letter to Dr. Antony Baumgartner letting him know that the patient did not need a colonoscopy until 2027. He stated that he had referred the patient for a hydrogen breath test for SIBO rather than a colonoscopy. (Letter will be in patient's chart under media) Patient was informed that he had an appointment scheduled with Dr. Leamon Pringle in May and that he was to discuss this matter with him. Patient had no further questions. A letter by Dr. Antony Baumgartner was faxed to Dr. Leamon Pringle with his explanations.

## 2023-05-30 DIAGNOSIS — Z419 Encounter for procedure for purposes other than remedying health state, unspecified: Secondary | ICD-10-CM | POA: Diagnosis not present

## 2023-06-03 LAB — LAB REPORT - SCANNED
A1c: 5.4
EGFR: 75

## 2023-06-30 DIAGNOSIS — Z419 Encounter for procedure for purposes other than remedying health state, unspecified: Secondary | ICD-10-CM | POA: Diagnosis not present

## 2023-07-25 ENCOUNTER — Ambulatory Visit: Attending: Internal Medicine | Admitting: Internal Medicine

## 2023-07-25 ENCOUNTER — Encounter: Payer: Self-pay | Admitting: Internal Medicine

## 2023-07-25 VITALS — BP 142/92 | HR 80 | Resp 14 | Ht 78.25 in | Wt 324.0 lb

## 2023-07-25 DIAGNOSIS — R748 Abnormal levels of other serum enzymes: Secondary | ICD-10-CM

## 2023-07-25 DIAGNOSIS — M7918 Myalgia, other site: Secondary | ICD-10-CM

## 2023-07-25 DIAGNOSIS — R700 Elevated erythrocyte sedimentation rate: Secondary | ICD-10-CM | POA: Diagnosis not present

## 2023-07-25 DIAGNOSIS — M5431 Sciatica, right side: Secondary | ICD-10-CM

## 2023-07-25 DIAGNOSIS — M5432 Sciatica, left side: Secondary | ICD-10-CM
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
## 2023-07-29 LAB — ANA,IFA RA DIAG PNL W/RFLX TIT/PATN
Anti Nuclear Antibody (ANA): POSITIVE — AB
Cyclic Citrullin Peptide Ab: <16 U
Rheumatoid fact SerPl-aCnc: <10 [IU]/mL (ref <14)

## 2023-07-29 LAB — COMPREHENSIVE METABOLIC PANEL WITH GFR
AG Ratio: 1.4 (calc) (ref 1.0–2.5)
ALT: 23 U/L (ref 9–46)
AST: 31 U/L (ref 10–35)
Albumin: 4.2 g/dL (ref 3.6–5.1)
Alkaline phosphatase (APISO): 55 U/L (ref 35–144)
BUN/Creatinine Ratio: 13 (calc) (ref 6–22)
BUN: 18 mg/dL (ref 7–25)
CO2: 27 mmol/L (ref 20–32)
Calcium: 9.7 mg/dL (ref 8.6–10.3)
Chloride: 105 mmol/L (ref 98–110)
Creat: 1.34 mg/dL — ABNORMAL HIGH (ref 0.70–1.30)
Globulin: 3 g/dL (ref 1.9–3.7)
Glucose, Bld: 91 mg/dL (ref 65–139)
Potassium: 5.1 mmol/L (ref 3.5–5.3)
Sodium: 141 mmol/L (ref 135–146)
Total Bilirubin: 0.4 mg/dL (ref 0.2–1.2)
Total Protein: 7.2 g/dL (ref 6.1–8.1)
eGFR: 61 mL/min/1.73m2 (ref >=60)

## 2023-07-29 LAB — MYOSITIS SPECIFIC II ANTIBODIES PANEL
EJ AB: <11 SI (ref <11)
JO-1 AB: <11 SI (ref <11)
MDA-5 AB: 12 SI — ABNORMAL HIGH (ref <11)
MI-2 ALPHA AB: <11 SI (ref <11)
MI-2 BETA AB: <11 SI (ref <11)
NXP-2 AB: <11 SI (ref <11)
OJ AB: <11 SI (ref <11)
PL-12 AB: <11 SI (ref <11)
PL-7 AB: <11 SI (ref <11)
SRP-AB: <11 SI (ref <11)
TIF-1y AB: <11 SI (ref <11)

## 2023-07-29 LAB — ANTI-NUCLEAR AB-TITER (ANA TITER)
ANA TITER: 1:40 {titer} — ABNORMAL HIGH
ANA Titer 1: 1:40 {titer} — ABNORMAL HIGH

## 2023-07-29 LAB — ALDOLASE: Aldolase: 11.2 U/L — ABNORMAL HIGH (ref <=8.1)

## 2023-07-29 LAB — SEDIMENTATION RATE: Sed Rate: 9 mm/h (ref 0–20)

## 2023-07-29 LAB — CK: Total CK: 1267 U/L — ABNORMAL HIGH (ref 23–325)

## 2023-07-30 DIAGNOSIS — Z419 Encounter for procedure for purposes other than remedying health state, unspecified: Secondary | ICD-10-CM | POA: Diagnosis not present

## 2023-07-31 DIAGNOSIS — R079 Chest pain, unspecified: Secondary | ICD-10-CM | POA: Diagnosis not present

## 2023-07-31 DIAGNOSIS — S0990XA Unspecified injury of head, initial encounter: Secondary | ICD-10-CM | POA: Diagnosis present

## 2023-07-31 DIAGNOSIS — Z79899 Other long term (current) drug therapy: Secondary | ICD-10-CM | POA: Diagnosis not present

## 2023-07-31 DIAGNOSIS — W19XXXA Unspecified fall, initial encounter: Secondary | ICD-10-CM | POA: Insufficient documentation

## 2023-07-31 DIAGNOSIS — Z7982 Long term (current) use of aspirin: Secondary | ICD-10-CM | POA: Insufficient documentation

## 2023-07-31 DIAGNOSIS — I7123 Aneurysm of the descending thoracic aorta, without rupture: Secondary | ICD-10-CM | POA: Diagnosis not present

## 2023-07-31 DIAGNOSIS — I10 Essential (primary) hypertension: Secondary | ICD-10-CM | POA: Diagnosis not present

## 2023-07-31 DIAGNOSIS — J029 Acute pharyngitis, unspecified: Secondary | ICD-10-CM | POA: Insufficient documentation

## 2023-08-01 ENCOUNTER — Other Ambulatory Visit: Payer: Self-pay

## 2023-08-01 ENCOUNTER — Emergency Department

## 2023-08-01 ENCOUNTER — Emergency Department
Admission: EM | Admit: 2023-08-01 | Discharge: 2023-08-01 | Disposition: A | Discharge status: Home / Self Care | Attending: Emergency Medicine | Admitting: Emergency Medicine

## 2023-08-01 DIAGNOSIS — S0990XA Unspecified injury of head, initial encounter: Secondary | ICD-10-CM

## 2023-08-01 DIAGNOSIS — W19XXXA Unspecified fall, initial encounter: Secondary | ICD-10-CM

## 2023-08-01 DIAGNOSIS — J029 Acute pharyngitis, unspecified: Secondary | ICD-10-CM

## 2023-08-01 DIAGNOSIS — R079 Chest pain, unspecified: Secondary | ICD-10-CM

## 2023-08-01 DIAGNOSIS — I7123 Aneurysm of the descending thoracic aorta, without rupture: Secondary | ICD-10-CM

## 2023-08-01 LAB — BASIC METABOLIC PANEL WITH GFR
Anion gap: 7 (ref 5–15)
BUN: 17 mg/dL (ref 6–20)
CO2: 23 mmol/L (ref 22–32)
Calcium: 9.1 mg/dL (ref 8.9–10.3)
Chloride: 109 mmol/L (ref 98–111)
Creatinine, Ser: 0.78 mg/dL (ref 0.61–1.24)
GFR, Estimated: >60 mL/min (ref >60)
Glucose, Bld: 113 mg/dL — ABNORMAL HIGH (ref 70–99)
Potassium: 3.8 mmol/L (ref 3.5–5.1)
Sodium: 139 mmol/L (ref 135–145)

## 2023-08-01 LAB — GROUP A STREP BY PCR: Group A Strep by PCR: NOT DETECTED

## 2023-08-01 LAB — HEPATIC FUNCTION PANEL
ALT: 20 U/L (ref 0–44)
AST: 30 U/L (ref 15–41)
Albumin: 3.9 g/dL (ref 3.5–5.0)
Alkaline Phosphatase: 45 U/L (ref 38–126)
Bilirubin, Direct: <0.1 mg/dL (ref 0.0–0.2)
Total Bilirubin: 0.6 mg/dL (ref 0.0–1.2)
Total Protein: 7.2 g/dL (ref 6.5–8.1)

## 2023-08-01 LAB — CBC
HCT: 40.3 % (ref 39.0–52.0)
Hemoglobin: 14.2 g/dL (ref 13.0–17.0)
MCH: 27.9 pg (ref 26.0–34.0)
MCHC: 35.2 g/dL (ref 30.0–36.0)
MCV: 79.2 fL — ABNORMAL LOW (ref 80.0–100.0)
Platelets: 236 K/uL (ref 150–400)
RBC: 5.09 MIL/uL (ref 4.22–5.81)
RDW: 13.6 % (ref 11.5–15.5)
WBC: 5.5 K/uL (ref 4.0–10.5)
nRBC: 0 % (ref 0.0–0.2)

## 2023-08-01 LAB — TROPONIN I (HIGH SENSITIVITY)
Troponin I (High Sensitivity): 5 ng/L (ref <18)
Troponin I (High Sensitivity): 5 ng/L (ref <18)

## 2023-08-01 LAB — LIPASE, BLOOD: Lipase: 53 U/L — ABNORMAL HIGH (ref 11–51)

## 2023-08-01 MED ORDER — ONDANSETRON HCL 4 MG/2ML IJ SOLN
4.0000 mg | Freq: Once | INTRAMUSCULAR | Status: AC
  Administered 2023-08-01: 4 mg via INTRAVENOUS
  Filled 2023-08-01: qty 2

## 2023-08-01 MED ORDER — DEXAMETHASONE SODIUM PHOSPHATE 10 MG/ML IJ SOLN
10.0000 mg | Freq: Once | INTRAMUSCULAR | Status: AC
  Administered 2023-08-01: 10 mg via INTRAVENOUS
  Filled 2023-08-01: qty 1

## 2023-08-01 MED ORDER — SODIUM CHLORIDE 0.9 % IV BOLUS (SEPSIS)
1000.0000 mL | Freq: Once | INTRAVENOUS | Status: AC
  Administered 2023-08-01: 1000 mL via INTRAVENOUS

## 2023-08-01 MED ORDER — METHOCARBAMOL 500 MG PO TABS
500.0000 mg | ORAL_TABLET | Freq: Three times a day (TID) | ORAL | 0 refills | Status: AC | PRN
Start: 2023-08-01 — End: ?

## 2023-08-01 MED ORDER — KETOROLAC TROMETHAMINE 30 MG/ML IJ SOLN
30.0000 mg | Freq: Once | INTRAMUSCULAR | Status: AC
  Administered 2023-08-01: 30 mg via INTRAVENOUS
  Filled 2023-08-01: qty 1

## 2023-08-01 MED ORDER — METHOCARBAMOL 1000 MG/10ML IJ SOLN: 500.0000 mg | Freq: Once | INTRAMUSCULAR | Status: DC

## 2023-08-01 MED ORDER — IOHEXOL 350 MG/ML SOLN
100.0000 mL | Freq: Once | INTRAVENOUS | Status: AC | PRN
  Administered 2023-08-01: 100 mL via INTRAVENOUS

## 2023-08-01 MED ORDER — HYDROMORPHONE HCL 1 MG/ML IJ SOLN
1.0000 mg | Freq: Once | INTRAMUSCULAR | Status: AC
  Administered 2023-08-01: 1 mg via INTRAVENOUS
  Filled 2023-08-01: qty 1

## 2023-08-01 MED ORDER — METHOCARBAMOL 1000 MG/10ML IJ SOLN
500.0000 mg | Freq: Once | INTRAMUSCULAR | Status: AC
  Administered 2023-08-01: 500 mg via INTRAVENOUS
  Filled 2023-08-01: qty 5

## 2023-08-01 NOTE — ED Notes (Signed)
 Pt is refusing Covid test but was compliant with strep test

## 2023-08-01 NOTE — ED Provider Notes (Signed)
 [facility] Provider Note    Event Date/Time   First MD Initiated Contact with Patient 08/01/23 [phone]     (approximate)   History   Chest Pain   HPI  Juan Ramos is a 58 y.o. male with history of traumatic brain injury, hypertension who presents to the emergency department with multiple complaints.  Patient reports that he fell today injuring his right chest, back hitting his head.  States that his legs just gave out on him when he went down to the ground.  He denies losing consciousness but does state he had blurry vision for some time.  No numbness, tingling, focal weakness, vomiting.  He is not on any blood thinners.  He is complaining of right  sided chest pain, right upper abdominal pain, diffuse neck and back pain from his fall.  He states after the fall he started having chest tightness and has noticed increased pain with deep inspiration.  He denies history of PE, DVT.  No calf tenderness or calf swelling.  States he started to feel short of breath as well.  States he was concerned he was having a heart attack.  History provided by patient.    Past Medical History:  Diagnosis Date   Hypertension    Legally blind    Sensorineural hearing loss (SNHL), bilateral 05/02/2023   TBI (traumatic brain injury) [facility])     Past Surgical History:  Procedure Laterality Date   FOOT SURGERY Bilateral    HERNIA REPAIR     SHOULDER SURGERY Bilateral    WRIST SURGERY Left     MEDICATIONS:  Prior to Admission medications   Medication Sig Start Date End Date Taking? Authorizing Provider  amLODipine (NORVASC) 10 MG tablet Take 10 mg by mouth daily. 12/20/20   [provider]  Aspirin -Calcium Carbonate 81-777 MG TABS Take by mouth.    [provider]  atenolol (TENORMIN) 25 MG tablet Take by mouth.    [provider]  diclofenac sodium (VOLTAREN) 1 % GEL Place onto the skin.    [provider]  dicyclomine (BENTYL) 10 MG  capsule Take by mouth.    [provider]  DULoxetine (CYMBALTA) 30 MG capsule Take by mouth.    [provider]  HYDROcodone -acetaminophen  (NORCO/VICODIN) 5-325 MG tablet Take 1 tablet by mouth every 6 (six) hours as needed for moderate pain (pain score 4-6). 11/18/22   Fisher, Devere ORN, PA-C  lactulose  (CHRONULAC ) 10 GM/15ML solution Take 15 mLs (10 g total) by mouth in the morning, at noon, in the evening, and at bedtime. 05/03/23   Anna, Kiran, MD  loratadine (CLARITIN) 10 MG tablet Take by mouth.    [provider]  losartan (COZAAR) 50 MG tablet Take 25 mg by mouth daily. 12/20/20   [provider]  naproxen  (NAPROSYN ) 500 MG tablet Take by mouth.    [provider]  omeprazole  (PRILOSEC) 40 MG capsule Take 1 capsule (40 mg total) by mouth daily. 03/17/23   Anna, Kiran, MD  oxybutynin (DITROPAN-XL) 10 MG 24 hr tablet Take by mouth. 11/15/17   [provider]  polyethylene glycol (MIRALAX / GLYCOLAX) packet Take by mouth.    [provider]  sildenafil (VIAGRA) 100 MG tablet Take by mouth.    [provider]  SUMAtriptan (IMITREX) 100 MG tablet Take by mouth.    [provider]  SUMAtriptan 6 MG/0.5ML SOAJ Inject into the skin.    [provider]  topiramate (TOPAMAX)  25 MG capsule Take by mouth.    [provider]  venlafaxine (EFFEXOR) 75 MG tablet Take by mouth.    [provider]    Physical Exam   Triage Vital Signs: ED Triage Vitals  Encounter Vitals Group     BP 08/01/23 0003 (!) 169/103     Girls Systolic BP Percentile --      Girls Diastolic BP Percentile --      Boys Systolic BP Percentile --      Boys Diastolic BP Percentile --      Pulse Rate 08/01/23 0003 79     Resp 08/01/23 0003 14     Temp 08/01/23 0003 98.6 F (37 C)     Temp Source 08/01/23 0003 Oral     SpO2 08/01/23 0003 98 %     Weight 08/01/23 0052 300 lb (136.1 kg)     Height 08/01/23 0052 6' (1.829 m)      Head Circumference --      Peak Flow --      Pain Score 08/01/23 0003 9     Pain Loc --      Pain Education --      Exclude from Growth Chart --     Most recent vital signs: Vitals:   08/01/23 0100 08/01/23 0305  BP: (!) 143/92 (!) 139/97  Pulse: 75 76  Resp:  18  Temp:    SpO2: 99% 100%     CONSTITUTIONAL: Alert, responds appropriately to questions. Well-appearing; well-nourished; GCS 15 HEAD: Normocephalic; atraumatic EYES: Conjunctivae clear, PERRL, EOMI ENT: normal nose; no rhinorrhea; moist mucous membranes; pharynx without lesions noted; no dental injury; no septal hematoma, no epistaxis; no facial deformity or bony tenderness; No pharyngeal erythema or petechiae, no tonsillar hypertrophy or exudate, no uvular deviation, no unilateral swelling in posterior oropharynx, no trismus or drooling, no muffled voice, normal phonation, no stridor, airway patent. NECK: Supple, tender throughout the cervical spine without step-off or deformity CARD: RRR; S1 and S2 appreciated; no murmurs, no clicks, no rubs, no gallops RESP: Normal chest excursion without splinting or tachypnea; breath sounds clear and equal bilaterally; no wheezes, no rhonchi, no rales; no hypoxia or respiratory distress CHEST:  chest wall stable, no crepitus or ecchymosis or deformity, tender over the right chest wall ABD/GI: Non-distended; soft, tender in the right upper quadrant without guarding or rebound, no ecchymosis or soft tissue swelling PELVIS:  stable, nontender to palpation BACK:  The back appears normal; diffusely tender over the thoracic and lumbar spine without step-off or deformity EXT: Normal ROM in all joints; no edema; normal capillary refill; no cyanosis, no bony tenderness or bony deformity of patient's extremities, no joint effusions, compartments are soft, extremities are warm and well-perfused, no ecchymosis, no calf tenderness or calf swelling SKIN: Normal color for age and race; warm NEURO:  No facial asymmetry, normal speech, moving all extremities equally  ED Results / Procedures / Treatments   LABS: (all labs ordered are listed, but only abnormal results are displayed) Labs Reviewed  BASIC METABOLIC PANEL WITH GFR - Abnormal; Notable for the following components:      Result Value   Glucose, Bld 113 (*)    All other components within normal limits  CBC - Abnormal; Notable for the following components:   MCV 79.2 (*)    All other components within normal limits  LIPASE, BLOOD - Abnormal; Notable for the following components:   Lipase 53 (*)    All other components  within normal limits  GROUP A STREP BY PCR  RESP PANEL BY RT-PCR (RSV, FLU A&B, COVID)  [id number]  HEPATIC FUNCTION PANEL  TROPONIN I (HIGH SENSITIVITY)  TROPONIN I (HIGH SENSITIVITY)     EKG:  EKG Interpretation Date/Time:  Sunday July 31 2023 23:58:22 EDT Ventricular Rate:  82 PR Interval:  156 QRS Duration:  88 QT Interval:  388 QTC Calculation: 453 R Axis:   54  Text Interpretation: Normal sinus rhythm Nonspecific T wave abnormality Abnormal ECG When compared with ECG of 18-Nov-2022 16:02, No significant change was found Confirmed by Neomi Neptune [phone]) on 08/01/2023 12:54:36 AM          RADIOLOGY: My personal review and interpretation of imaging: CTs show no acute traumatic injury.  I have personally reviewed all radiology reports. CT HEAD WO CONTRAST ( ) Result Date: 08/01/2023 CLINICAL DATA:  Head trauma, moderate-severe; Neck trauma, midline tenderness (Age 74-64y) EXAM: CT HEAD WITHOUT CONTRAST CT CERVICAL SPINE WITHOUT CONTRAST TECHNIQUE: Multidetector CT imaging of the head and cervical spine was performed following the standard protocol without intravenous contrast. Multiplanar CT image reconstructions of the cervical spine were also generated. RADIATION DOSE REDUCTION: This exam was performed according to the departmental dose-optimization program which includes automated exposure  control, adjustment of the mA and/or kV according to patient size and/or use of iterative reconstruction technique. COMPARISON:  CT head and CT cervical spine November 18, 2022. FINDINGS: CT HEAD FINDINGS Brain: No evidence of acute infarction, hemorrhage, hydrocephalus, extra-axial collection or mass lesion/mass effect. Vascular: No hyperdense vessel. Skull: No acute fracture. Sinuses/Orbits: Clear sinuses.  No acute orbital findings. Other: No mastoid effusions. CT CERVICAL SPINE FINDINGS Alignment: No substantial sagittal subluxation. Skull base and vertebrae: Vertebral body heights are maintained. Soft tissues and spinal canal: No prevertebral fluid or swelling. No visible canal hematoma. Disc levels: Mild-to-moderate multilevel bony degenerative change. Central disc protrusion at C3-C4 resulting in least mild canal stenosis. Upper chest: Visualized lung apices are clear. IMPRESSION: 1. No evidence of acute intracranial abnormality. 2. No evidence of acute fracture or traumatic malalignment in the cervical spine. Electronically Signed   By: Gilmore GORMAN Molt M.D.   On: 08/01/2023 01:53   CT Cervical Spine Wo Contrast Result Date: 08/01/2023 CLINICAL DATA:  Head trauma, moderate-severe; Neck trauma, midline tenderness (Age 67-64y) EXAM: CT HEAD WITHOUT CONTRAST CT CERVICAL SPINE WITHOUT CONTRAST TECHNIQUE: Multidetector CT imaging of the head and cervical spine was performed following the standard protocol without intravenous contrast. Multiplanar CT image reconstructions of the cervical spine were also generated. RADIATION DOSE REDUCTION: This exam was performed according to the departmental dose-optimization program which includes automated exposure control, adjustment of the mA and/or kV according to patient size and/or use of iterative reconstruction technique. COMPARISON:  CT head and CT cervical spine November 18, 2022. FINDINGS: CT HEAD FINDINGS Brain: No evidence of acute infarction, hemorrhage,  hydrocephalus, extra-axial collection or mass lesion/mass effect. Vascular: No hyperdense vessel. Skull: No acute fracture. Sinuses/Orbits: Clear sinuses.  No acute orbital findings. Other: No mastoid effusions. CT CERVICAL SPINE FINDINGS Alignment: No substantial sagittal subluxation. Skull base and vertebrae: Vertebral body heights are maintained. Soft tissues and spinal canal: No prevertebral fluid or swelling. No visible canal hematoma. Disc levels: Mild-to-moderate multilevel bony degenerative change. Central disc protrusion at C3-C4 resulting in least mild canal stenosis. Upper chest: Visualized lung apices are clear. IMPRESSION: 1. No evidence of acute intracranial abnormality. 2. No evidence of acute fracture or traumatic malalignment in the cervical spine. Electronically Signed  By: Gilmore GORMAN Molt M.D.   On: 08/01/2023 01:53   CT Angio Chest PE W and/or Wo Contrast Result Date: 08/01/2023 CLINICAL DATA:  Pulmonary embolism (PE) suspected, high prob Pleuritic right-sided chest pain, fall, evaluate for PE, rib fracture; Abdominal trauma, blunt Right-sided abdominal pain after a fall EXAM: CT ANGIOGRAPHY CHEST CT ABDOMEN AND PELVIS WITH CONTRAST TECHNIQUE: Multidetector CT imaging of the chest was performed using the standard protocol during bolus administration of intravenous contrast. Multiplanar CT image reconstructions and MIPs were obtained to evaluate the vascular anatomy. Multidetector CT imaging of the abdomen and pelvis was performed using the standard protocol during bolus administration of intravenous contrast. RADIATION DOSE REDUCTION: This exam was performed according to the departmental dose-optimization program which includes automated exposure control, adjustment of the mA and/or kV according to patient size and/or use of iterative reconstruction technique. CONTRAST:  OMNIPAQUE  IOHEXOL  350 MG/ML SOLN COMPARISON:  04/01/2021 FINDINGS: CTA CHEST FINDINGS Cardiovascular: Adequate  opacification of the pulmonary arterial tree. No intraluminal filling defect identified to suggest acute pulmonary embolism. Central pulmonary arteries are of normal caliber. No significant coronary artery calcification. Cardiac size is within normal limits. No pericardial effusion. Mild atherosclerotic calcification within the thoracic aorta. There is progressive fusiform dilation of the proximal descending thoracic aorta which measures 4.3 cm in diameter (previously measuring 3.9 cm). Ascending and distal descending thoracic aorta are caliber. Mediastinum/Nodes: No enlarged mediastinal, hilar, or axillary lymph nodes. Thyroid gland, trachea, and esophagus demonstrate no significant findings. Lungs/Pleura: Mild bronchial wall thickening keeping with airway inflammation. No pneumothorax or pleural effusion. No confluent pulmonary infiltrate. No pneumothorax or pleural effusion. Musculoskeletal: No acute bone abnormality. No lytic or blastic bone lesion. Osseous structures are age appropriate. Review of the MIP images confirms the above findings. CT ABDOMEN and PELVIS FINDINGS Hepatobiliary: Scattered cysts within the liver. No enhancing intrahepatic mass. No intra or extrahepatic biliary ductal dilation. Gallbladder unremarkable. Pancreas: Unremarkable. No pancreatic ductal dilatation or surrounding inflammatory changes. Spleen: Normal in size without focal abnormality. Adrenals/Urinary Tract: Adrenal glands are unremarkable. Kidneys are normal, without renal calculi, focal lesion, or hydronephrosis. Bladder is unremarkable. Stomach/Bowel: Stomach is within normal limits. Appendix appears normal. No evidence of bowel wall thickening, distention, or inflammatory changes. Vascular/Lymphatic: No significant vascular findings are present. No enlarged abdominal or pelvic lymph nodes. Reproductive: Prostate is unremarkable. Other: Bilateral inguinal hernia repair with mesh. No recurrent hernia identified. No  abdominopelvic ascites. Musculoskeletal: No acute bone abnormality. No lytic or blastic bone lesion. Osseous structures are age appropriate. Review of the MIP images confirms the above findings. IMPRESSION: 1. No evidence of pulmonary embolism. 2. Mild bronchial wall thickening in keeping with airway inflammation. 3. No acute intrathoracic or intra-abdominal pathology identified. 4. Progressive fusiform dilation of the proximal descending thoracic aorta which measures 4.3 cm in diameter (previously measuring 3.9 cm). Recommend semi-annual imaging followup by CTA or MRA and referral to cardiothoracic surgery if not already obtained. This recommendation follows 2010 ACCF/AHA/AATS/ACR/ASA/SCA/SCAI/SIR/STS/SVM Guidelines for the Diagnosis and Management of Patients With Thoracic Aortic Disease. Circulation. 2010; 121: Z733-z63. Aortic aneurysm NOS (ICD10-I71.9) 5. Aortic atherosclerosis. Aortic Atherosclerosis (ICD10-I70.0). Electronically Signed   By: Dorethia Molt M.D.   On: 08/01/2023 01:51   CT ABDOMEN PELVIS W CONTRAST Result Date: 08/01/2023 CLINICAL DATA:  Pulmonary embolism (PE) suspected, high prob Pleuritic right-sided chest pain, fall, evaluate for PE, rib fracture; Abdominal trauma, blunt Right-sided abdominal pain after a fall EXAM: CT ANGIOGRAPHY CHEST CT ABDOMEN AND PELVIS WITH CONTRAST TECHNIQUE: Multidetector CT imaging  of the chest was performed using the standard protocol during bolus administration of intravenous contrast. Multiplanar CT image reconstructions and MIPs were obtained to evaluate the vascular anatomy. Multidetector CT imaging of the abdomen and pelvis was performed using the standard protocol during bolus administration of intravenous contrast. RADIATION DOSE REDUCTION: This exam was performed according to the departmental dose-optimization program which includes automated exposure control, adjustment of the mA and/or kV according to patient size and/or use of iterative  reconstruction technique. CONTRAST:  OMNIPAQUE  IOHEXOL  350 MG/ML SOLN COMPARISON:  04/01/2021 FINDINGS: CTA CHEST FINDINGS Cardiovascular: Adequate opacification of the pulmonary arterial tree. No intraluminal filling defect identified to suggest acute pulmonary embolism. Central pulmonary arteries are of normal caliber. No significant coronary artery calcification. Cardiac size is within normal limits. No pericardial effusion. Mild atherosclerotic calcification within the thoracic aorta. There is progressive fusiform dilation of the proximal descending thoracic aorta which measures 4.3 cm in diameter (previously measuring 3.9 cm). Ascending and distal descending thoracic aorta are caliber. Mediastinum/Nodes: No enlarged mediastinal, hilar, or axillary lymph nodes. Thyroid gland, trachea, and esophagus demonstrate no significant findings. Lungs/Pleura: Mild bronchial wall thickening keeping with airway inflammation. No pneumothorax or pleural effusion. No confluent pulmonary infiltrate. No pneumothorax or pleural effusion. Musculoskeletal: No acute bone abnormality. No lytic or blastic bone lesion. Osseous structures are age appropriate. Review of the MIP images confirms the above findings. CT ABDOMEN and PELVIS FINDINGS Hepatobiliary: Scattered cysts within the liver. No enhancing intrahepatic mass. No intra or extrahepatic biliary ductal dilation. Gallbladder unremarkable. Pancreas: Unremarkable. No pancreatic ductal dilatation or surrounding inflammatory changes. Spleen: Normal in size without focal abnormality. Adrenals/Urinary Tract: Adrenal glands are unremarkable. Kidneys are normal, without renal calculi, focal lesion, or hydronephrosis. Bladder is unremarkable. Stomach/Bowel: Stomach is within normal limits. Appendix appears normal. No evidence of bowel wall thickening, distention, or inflammatory changes. Vascular/Lymphatic: No significant vascular findings are present. No enlarged abdominal or  pelvic lymph nodes. Reproductive: Prostate is unremarkable. Other: Bilateral inguinal hernia repair with mesh. No recurrent hernia identified. No abdominopelvic ascites. Musculoskeletal: No acute bone abnormality. No lytic or blastic bone lesion. Osseous structures are age appropriate. Review of the MIP images confirms the above findings. IMPRESSION: 1. No evidence of pulmonary embolism. 2. Mild bronchial wall thickening in keeping with airway inflammation. 3. No acute intrathoracic or intra-abdominal pathology identified. 4. Progressive fusiform dilation of the proximal descending thoracic aorta which measures 4.3 cm in diameter (previously measuring 3.9 cm). Recommend semi-annual imaging followup by CTA or MRA and referral to cardiothoracic surgery if not already obtained. This recommendation follows 2010 ACCF/AHA/AATS/ACR/ASA/SCA/SCAI/SIR/STS/SVM Guidelines for the Diagnosis and Management of Patients With Thoracic Aortic Disease. Circulation. 2010; 121: [id number]. Aortic aneurysm NOS (ICD10-I71.9) 5. Aortic atherosclerosis. Aortic Atherosclerosis (ICD10-I70.0). Electronically Signed   By: Dorethia Molt M.D.   On: 08/01/2023 01:51   CT T-SPINE NO CHARGE Result Date: 08/01/2023 CLINICAL DATA:  Chest heaviness after mechanical fall today. Patient fell forward. Lower back pain. EXAM: CT THORACIC AND LUMBAR SPINE WITHOUT CONTRAST TECHNIQUE: Multidetector CT imaging of the thoracic and lumbar spine was performed without contrast. Multiplanar CT image reconstructions were also generated. RADIATION DOSE REDUCTION: This exam was performed according to the departmental dose-optimization program which includes automated exposure control, adjustment of the mA and/or kV according to patient size and/or use of iterative reconstruction technique. COMPARISON:  Lumbar spine radiographs 11/18/2022 and CT abdomen pelvis 04/24/2021; CT chest abdomen pelvis 04/01/2021 FINDINGS: CT THORACIC SPINE FINDINGS Alignment: No evidence of  traumatic listhesis. Vertebrae: No  acute fracture. Paraspinal and other soft tissues: Reported separately. Disc levels: Mild age commensurate spondylosis. Intervertebral disc space height is maintained. No severe spinal canal narrowing. CT LUMBAR SPINE FINDINGS Segmentation: 5 lumbar type vertebrae. Alignment: No evidence of traumatic listhesis. Vertebrae: No acute fracture. Paraspinal and other soft tissues: Reported separately. Disc levels: Moderate to advanced disc space height loss and degenerative endplate changes with vacuum phenomenon at L5-S1. Unchanged grade 1 retrolisthesis of L5. No severe spinal canal or neural foraminal narrowing. IMPRESSION: 1. No acute fracture in the thoracic or lumbar spine. 2. Moderate to advanced degenerative disc disease at L5-S1. Electronically Signed   By: Norman Gatlin M.D.   On: 08/01/2023 01:48   CT L-SPINE NO CHARGE Result Date: 08/01/2023 CLINICAL DATA:  Chest heaviness after mechanical fall today. Patient fell forward. Lower back pain. EXAM: CT THORACIC AND LUMBAR SPINE WITHOUT CONTRAST TECHNIQUE: Multidetector CT imaging of the thoracic and lumbar spine was performed without contrast. Multiplanar CT image reconstructions were also generated. RADIATION DOSE REDUCTION: This exam was performed according to the departmental dose-optimization program which includes automated exposure control, adjustment of the mA and/or kV according to patient size and/or use of iterative reconstruction technique. COMPARISON:  Lumbar spine radiographs 11/18/2022 and CT abdomen pelvis 04/24/2021; CT chest abdomen pelvis 04/01/2021 FINDINGS: CT THORACIC SPINE FINDINGS Alignment: No evidence of traumatic listhesis. Vertebrae: No acute fracture. Paraspinal and other soft tissues: Reported separately. Disc levels: Mild age commensurate spondylosis. Intervertebral disc space height is maintained. No severe spinal canal narrowing. CT LUMBAR SPINE FINDINGS Segmentation: 5 lumbar type vertebrae.  Alignment: No evidence of traumatic listhesis. Vertebrae: No acute fracture. Paraspinal and other soft tissues: Reported separately. Disc levels: Moderate to advanced disc space height loss and degenerative endplate changes with vacuum phenomenon at L5-S1. Unchanged grade 1 retrolisthesis of L5. No severe spinal canal or neural foraminal narrowing. IMPRESSION: 1. No acute fracture in the thoracic or lumbar spine. 2. Moderate to advanced degenerative disc disease at L5-S1. Electronically Signed   By: Norman Gatlin M.D.   On: 08/01/2023 01:48   DG Chest 1 View Result Date: 08/01/2023 CLINICAL DATA:  Chest heaviness.  Mechanical fall today. EXAM: CHEST  1 VIEW COMPARISON:  11/18/2022 FINDINGS: Stable cardiomediastinal silhouette. No focal consolidation, pleural effusion, or pneumothorax. No displaced rib fractures. IMPRESSION: No active disease. Electronically Signed   By: Norman Gatlin M.D.   On: 08/01/2023 00:25     PROCEDURES:  Critical Care performed: No    .1-3 Lead EKG Interpretation  Performed by: Meighan Treto, Josette SAILOR, DO Authorized by: Shirley Decamp, Josette SAILOR, DO     Interpretation: normal     ECG rate:  76   ECG rate assessment: normal     Rhythm: sinus rhythm     Ectopy: none     Conduction: normal       IMPRESSION / MDM / ASSESSMENT AND PLAN / ED COURSE  I reviewed the triage vital signs and the nursing notes.  Patient here after fall with complaints of head injury, neck and back pain, chest pain, abdominal pain.  Afterwards developed chest tightness and pleurisy.  The patient is on the cardiac monitor to evaluate for evidence of arrhythmia and/or significant heart rate changes.   DIFFERENTIAL DIAGNOSIS (includes but not limited to):   Head injury, concussion, skull fracture, intracranial hemorrhage, spinal fracture, rib fractures, pneumothorax, liver contusion, liver laceration  ACS, PE, CHF, less likely pneumonia, dissection  Patient's presentation is most consistent with  acute presentation with potential threat  to life or bodily function.  PLAN: Will obtain CT scans to evaluate for traumatic injury but also to rule out PE.  Will obtain cardiac labs.  EKG nonischemic.  Will give pain medication.   MEDICATIONS GIVEN IN ED: Medications  HYDROmorphone  (DILAUDID ) injection 1 mg (1 mg Intravenous Given 08/01/23 0103)  ondansetron  (ZOFRAN ) injection 4 mg (4 mg Intravenous Given 08/01/23 0103)  iohexol  (OMNIPAQUE ) 350 MG/ML injection 100 mL (100 mLs Intravenous Contrast Given 08/01/23 0111)  sodium chloride  0.9 % bolus 1,000 mL (0 mLs Intravenous Stopped 08/01/23 0316)  methocarbamol  (ROBAXIN ) injection 500 mg (500 mg Intravenous Given 08/01/23 0315)  ketorolac  (TORADOL ) 30 MG/ML injection 30 mg (30 mg Intravenous Given 08/01/23 0223)  dexamethasone  (DECADRON ) injection 10 mg (10 mg Intravenous Given 08/01/23 0242)     ED COURSE: Patient's troponin x 2 is negative.  Normal electrolytes, renal function, LFTs.  Minimal elevation of his lipase.  Normal hemoglobin.  CT scans reviewed and interpreted by myself and the radiologist and showed no acute traumatic injury.  Incidentally he does have a progressive dilation of the proximal descending thoracic aorta measuring 4.3 cm in diameter.  Was previously 3.9 cm in March 2023.  He states that he was not aware of this finding.  I did recommend semiannual imaging as an outpatient and have provided him with cardiothoracic follow-up information in [place].  We discussed that I do not think this is the cause of his pain today.  I suspect that his chest pain is a chest wall contusion, muscle strain, spasm.  Recommended muscle relaxers, anti-inflammatories.  Patient was given multiple rounds of pain medication and appears more comfortable.  He is eating and drinking.  Prior to discharge patient tells me that he has also had nasal congestion and sore throat worse on the left side.  He is able to swallow without difficulty and has no  tonsillar hypertrophy, exudate, uvular deviation, trismus or drooling.  Will obtain strep swab but suspect viral pharyngitis.  I have offered COVID and flu swab as well but he declines.   Patient strep test is negative.  No indication for antibiotics.  He is afebrile with no leukocytosis.  No signs of PTA, deep space neck infection, meningitis.  I feel he is safe for discharge home.   At this time, I do not feel there is any life-threatening condition present. I reviewed all nursing notes, vitals, pertinent previous records.  All lab and urine results, EKGs, imaging ordered have been independently reviewed and interpreted by myself.  I reviewed all available radiology reports from any imaging ordered this visit.  Based on my assessment, I feel the patient is safe to be discharged home without further emergent workup and can continue workup as an outpatient as needed. Discussed all findings, treatment plan as well as usual and customary return precautions.  They verbalize understanding and are comfortable with this plan.  Outpatient follow-up has been provided as needed.  All questions have been answered.    CONSULTS:  none   OUTSIDE RECORDS REVIEWED: Reviewed last urology note in 2022.       FINAL CLINICAL IMPRESSION(S) / ED DIAGNOSES   Final diagnoses:  Fall, initial encounter  Injury of head, initial encounter  Right-sided chest pain  Aneurysm of descending thoracic aorta without rupture (HCC)  Viral pharyngitis     Rx / DC Orders   ED Discharge Orders          Ordered    methocarbamol  (ROBAXIN ) 500 MG tablet  Every 8 hours PRN        08/01/23 9666             Note:  This document was prepared using Dragon voice recognition software and may include unintentional dictation errors.   Regla Fitzgibbon, Josette SAILOR, DO 08/01/23 [phone]

## 2023-08-01 NOTE — ED Triage Notes (Signed)
 Pt presents via POV c/o chest heaviness. Reports had mechanical fall today. Reports fell forward. Afterwards had some lower back pain then developed chest heaviness.

## 2023-08-01 NOTE — ED Notes (Signed)
 Pt stated that his knees gave out... No LOC... Complaining of chest and back pain on palpation and having a headache

## 2023-08-01 NOTE — Discharge Instructions (Addendum)
 Progressive fusiform dilation of the proximal descending thoracic  aorta which measures 4.3 cm in diameter (previously measuring 3.9  cm). Recommend semi-annual imaging followup by CTA or MRA and  referral to cardiothoracic surgery if not already obtained.   You may alternate over the counter Tylenol  1000 mg every 6 hours as needed for pain, fever and Ibuprofen 800 mg every 6-8 hours as needed for pain, fever.  Please take Ibuprofen with food.  Do not take more than 4000 mg of Tylenol  (acetaminophen ) in a 24 hour period.

## 2023-08-23 NOTE — Progress Notes (Deleted)
 [address] [place] [address]             [phone]            Arend Bahl [id number] [birth year]   History of Present Illness: [name] is a 58 year old male with medical history of hypertension, GERD, migraine, history of traumatic brain injury, BPH, and osteoarthritis presents today for initial evaluation of ascending thoracic aortic aneurysm.  He was seen in the emergency department on 08/01/2023 for a fall and subsequent chest tightness.  CTA of chest was obtained and a 4.3 cm ascending aortic aneurysm was found. Blood pressure*** Symptoms*** Exercise***    Current Outpatient Medications on File Prior to Visit  Medication Sig Dispense Refill   amLODipine (NORVASC) 10 MG tablet Take 10 mg by mouth daily.     Aspirin -Calcium Carbonate 81-777 MG TABS Take by mouth.     atenolol (TENORMIN) 25 MG tablet Take by mouth.     diclofenac sodium (VOLTAREN) 1 % GEL Place onto the skin.     dicyclomine (BENTYL) 10 MG capsule Take by mouth.     DULoxetine (CYMBALTA) 30 MG capsule Take by mouth.     HYDROcodone -acetaminophen  (NORCO/VICODIN) 5-325 MG tablet Take 1 tablet by mouth every 6 (six) hours as needed for moderate pain (pain score 4-6). 8 tablet 0   lactulose  (CHRONULAC ) 10 GM/15ML solution Take 15 mLs (10 g total) by mouth in the morning, at noon, in the evening, and at bedtime. 1800 mL 11   loratadine (CLARITIN) 10 MG tablet Take by mouth.     losartan (COZAAR) 50 MG tablet Take 25 mg by mouth daily.     methocarbamol  (ROBAXIN ) 500 MG tablet Take 1 tablet (500 mg total) by mouth every 8 (eight) hours as needed. 15 tablet 0   naproxen  (NAPROSYN ) 500 MG tablet Take by mouth.     omeprazole  (PRILOSEC) 40 MG capsule Take 1 capsule (40 mg total) by mouth daily. 90 capsule 0   oxybutynin (DITROPAN-XL) 10 MG 24 hr tablet Take by mouth.     polyethylene glycol (MIRALAX / GLYCOLAX) packet Take by mouth.     sildenafil (VIAGRA) 100 MG tablet  Take by mouth.     SUMAtriptan (IMITREX) 100 MG tablet Take by mouth.     SUMAtriptan 6 MG/0.5ML SOAJ Inject into the skin.     topiramate (TOPAMAX) 25 MG capsule Take by mouth.     venlafaxine (EFFEXOR) 75 MG tablet Take by mouth.     No current facility-administered medications on file prior to visit.     ROS:   There were no vitals taken for this visit.    Imaging: Narrative & Impression  CLINICAL DATA:  Pulmonary embolism (PE) suspected, high prob Pleuritic right-sided chest pain, fall, evaluate for PE, rib fracture; Abdominal trauma, blunt Right-sided abdominal pain after a fall   EXAM: CT ANGIOGRAPHY CHEST   CT ABDOMEN AND PELVIS WITH CONTRAST   TECHNIQUE: Multidetector CT imaging of the chest was performed using the standard protocol during bolus administration of intravenous contrast. Multiplanar CT image reconstructions and MIPs were obtained to evaluate the vascular anatomy. Multidetector CT imaging of the abdomen and pelvis was performed using the standard protocol during bolus administration of intravenous contrast.   RADIATION DOSE REDUCTION: This exam was performed according to the departmental dose-optimization program which includes automated exposure control, adjustment of the mA and/or kV according to patient size and/or  use of iterative reconstruction technique.   CONTRAST:  OMNIPAQUE  IOHEXOL  350 MG/ML SOLN   COMPARISON:  04/01/2021   FINDINGS: CTA CHEST FINDINGS   Cardiovascular: Adequate opacification of the pulmonary arterial tree. No intraluminal filling defect identified to suggest acute pulmonary embolism. Central pulmonary arteries are of normal caliber. No significant coronary artery calcification. Cardiac size is within normal limits. No pericardial effusion. Mild atherosclerotic calcification within the thoracic aorta. There is progressive fusiform dilation of the proximal descending thoracic aorta which measures 4.3 cm in  diameter (previously measuring 3.9 cm). Ascending and distal descending thoracic aorta are caliber.   Mediastinum/Nodes: No enlarged mediastinal, hilar, or axillary lymph nodes. Thyroid gland, trachea, and esophagus demonstrate no significant findings.   Lungs/Pleura: Mild bronchial wall thickening keeping with airway inflammation. No pneumothorax or pleural effusion. No confluent pulmonary infiltrate. No pneumothorax or pleural effusion.   Musculoskeletal: No acute bone abnormality. No lytic or blastic bone lesion. Osseous structures are age appropriate.   Review of the MIP images confirms the above findings.   CT ABDOMEN and PELVIS FINDINGS   Hepatobiliary: Scattered cysts within the liver. No enhancing intrahepatic mass. No intra or extrahepatic biliary ductal dilation. Gallbladder unremarkable.   Pancreas: Unremarkable. No pancreatic ductal dilatation or surrounding inflammatory changes.   Spleen: Normal in size without focal abnormality.   Adrenals/Urinary Tract: Adrenal glands are unremarkable. Kidneys are normal, without renal calculi, focal lesion, or hydronephrosis. Bladder is unremarkable.   Stomach/Bowel: Stomach is within normal limits. Appendix appears normal. No evidence of bowel wall thickening, distention, or inflammatory changes.   Vascular/Lymphatic: No significant vascular findings are present. No enlarged abdominal or pelvic lymph nodes.   Reproductive: Prostate is unremarkable.   Other: Bilateral inguinal hernia repair with mesh. No recurrent hernia identified. No abdominopelvic ascites.   Musculoskeletal: No acute bone abnormality. No lytic or blastic bone lesion. Osseous structures are age appropriate.   Review of the MIP images confirms the above findings.   IMPRESSION: 1. No evidence of pulmonary embolism. 2. Mild bronchial wall thickening in keeping with airway inflammation. 3. No acute intrathoracic or intra-abdominal pathology  identified. 4. Progressive fusiform dilation of the proximal descending thoracic aorta which measures 4.3 cm in diameter (previously measuring 3.9 cm). Recommend semi-annual imaging followup by CTA or MRA and referral to cardiothoracic surgery if not already obtained. This recommendation follows 2010 ACCF/AHA/AATS/ACR/ASA/SCA/SCAI/SIR/STS/SVM Guidelines for the Diagnosis and Management of Patients With Thoracic Aortic Disease. Circulation. 2010; 121: Z733-z63. Aortic aneurysm NOS (ICD10-I71.9) 5. Aortic atherosclerosis.   Aortic Atherosclerosis (ICD10-I70.0).     Electronically Signed   By: Dorethia Molt M.D.   On: 08/01/2023 01:51       A/P: Aneurysm of ascending aorta without rupture (HCC) - 4.3 ascending thoracic aortic aneurysm on CTA of chest. We discussed the natural history and and risk factors for growth of ascending aortic aneurysms. Discussed recommendations to minimize the risk of further expansion or dissection including careful blood pressure control, avoidance of contact sports and heavy lifting, attention to lipid management.  We covered the importance of smoking ***cessation/staying never user.  The patient does not yet meet surgical criteria of >5.5cm. The patient is aware of signs and symptoms of aortic dissection and when to present to the emergency department       Risk Modification:  Statin:  ***  Smoking cessation instruction/counseling given:  {CHL AMB [facility] SMOKING CESSATION COUNSELING:20758}  Patient was counseled on importance of Blood Pressure Control  They are instructed to contact their Primary  Care Physician if they start to have blood pressure readings over 130s/90s. Do not ever stop blood pressure medications on your own, unless instructed by healthcare professional.  Please avoid use of Fluoroquinolones as this can potentially increase your risk of Aortic Rupture and/or Dissection  Patient educated on signs and symptoms of Aortic Dissection,  handout also provided in AVS  [name], PA-C 08/23/23

## 2023-08-24 ENCOUNTER — Ambulatory Visit

## 2023-08-24 DIAGNOSIS — I7121 Aneurysm of the ascending aorta, without rupture: Secondary | ICD-10-CM

## 2023-08-30 DIAGNOSIS — Z419 Encounter for procedure for purposes other than remedying health state, unspecified: Secondary | ICD-10-CM | POA: Diagnosis not present
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
## 2023-09-06 ENCOUNTER — Ambulatory Visit: Admitting: Internal Medicine

## 2023-09-07 NOTE — Progress Notes (Deleted)
       [address] [place] [address]             [phone]            Juan Ramos [id number] 25-Mar-1965   History of Present Illness:  [name] with medical history of 4.3 cm      Current Outpatient Medications on File Prior to Visit  Medication Sig Dispense Refill   amLODipine (NORVASC) 10 MG tablet Take 10 mg by mouth daily.     Aspirin -Calcium Carbonate 81-777 MG TABS Take by mouth.     atenolol (TENORMIN) 25 MG tablet Take by mouth.     diclofenac sodium (VOLTAREN) 1 % GEL Place onto the skin.     dicyclomine (BENTYL) 10 MG capsule Take by mouth.     DULoxetine (CYMBALTA) 30 MG capsule Take by mouth.     HYDROcodone -acetaminophen  (NORCO/VICODIN) 5-325 MG tablet Take 1 tablet by mouth every 6 (six) hours as needed for moderate pain (pain score 4-6). 8 tablet 0   lactulose  (CHRONULAC ) 10 GM/15ML solution Take 15 mLs (10 g total) by mouth in the morning, at noon, in the evening, and at bedtime. 1800 mL 11   loratadine (CLARITIN) 10 MG tablet Take by mouth.     losartan (COZAAR) 50 MG tablet Take 25 mg by mouth daily.     methocarbamol  (ROBAXIN ) 500 MG tablet Take 1 tablet (500 mg total) by mouth every 8 (eight) hours as needed. 15 tablet 0   naproxen  (NAPROSYN ) 500 MG tablet Take by mouth.     omeprazole  (PRILOSEC) 40 MG capsule Take 1 capsule (40 mg total) by mouth daily. 90 capsule 0   oxybutynin (DITROPAN-XL) 10 MG 24 hr tablet Take by mouth.     polyethylene glycol (MIRALAX / GLYCOLAX) packet Take by mouth.     sildenafil (VIAGRA) 100 MG tablet Take by mouth.     SUMAtriptan (IMITREX) 100 MG tablet Take by mouth.     SUMAtriptan 6 MG/0.5ML SOAJ Inject into the skin.     topiramate (TOPAMAX) 25 MG capsule Take by mouth.     venlafaxine (EFFEXOR) 75 MG tablet Take by mouth.     No current facility-administered medications on file prior to visit.     ROS:   There were no vitals taken for this  visit.    Imaging:      A/P:      Risk Modification:  Statin:  ***  Smoking cessation instruction/counseling given:  {CHL AMB [facility] SMOKING CESSATION COUNSELING:20758}  Patient was counseled on importance of Blood Pressure Control  They are instructed to contact their Primary Care Physician if they start to have blood pressure readings over 130s/90s. Do not ever stop blood pressure medications on your own, unless instructed by healthcare professional.  Please avoid use of Fluoroquinolones as this can potentially increase your risk of Aortic Rupture and/or Dissection  Patient educated on signs and symptoms of Aortic Dissection, handout also provided in AVS  Juan CHRISTELLA Rough, PA-C 09/07/23

## 2023-09-08 ENCOUNTER — Ambulatory Visit

## 2023-09-20 ENCOUNTER — Other Ambulatory Visit: Payer: Self-pay | Admitting: Otolaryngology

## 2023-09-20 ENCOUNTER — Encounter: Payer: Self-pay | Admitting: Internal Medicine

## 2023-09-20 ENCOUNTER — Encounter: Payer: Self-pay | Admitting: *Deleted

## 2023-09-20 ENCOUNTER — Ambulatory Visit: Attending: Internal Medicine | Admitting: Internal Medicine

## 2023-09-20 VITALS — BP 118/83 | HR 79 | Resp 16 | Ht 78.0 in | Wt 303.0 lb

## 2023-09-20 DIAGNOSIS — M791 Myalgia, unspecified site: Secondary | ICD-10-CM

## 2023-09-20 DIAGNOSIS — R768 Other specified abnormal immunological findings in serum: Secondary | ICD-10-CM

## 2023-09-20 DIAGNOSIS — R0789 Other chest pain: Secondary | ICD-10-CM | POA: Diagnosis not present

## 2023-09-20 DIAGNOSIS — R748 Abnormal levels of other serum enzymes: Secondary | ICD-10-CM | POA: Diagnosis not present

## 2023-09-20 DIAGNOSIS — J310 Chronic rhinitis: Secondary | ICD-10-CM
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
## 2023-09-26 ENCOUNTER — Ambulatory Visit

## 2023-09-26 VITALS — BP 134/89 | HR 73 | Resp 20 | Ht 78.0 in | Wt 303.0 lb

## 2023-09-26 DIAGNOSIS — I77819 Aortic ectasia, unspecified site: Secondary | ICD-10-CM | POA: Insufficient documentation

## 2023-09-26 DIAGNOSIS — I7121 Aneurysm of the ascending aorta, without rupture: Secondary | ICD-10-CM | POA: Diagnosis present

## 2023-09-26 DIAGNOSIS — R0789 Other chest pain: Secondary | ICD-10-CM | POA: Insufficient documentation

## 2023-09-26 LAB — CK TOTAL AND CKMB (NOT AT ARMC)
CK, MB: 2.7 ng/mL (ref 0–5.0)
Relative Index: 0.5 (ref 0–4.0)
Total CK: 591 U/L — ABNORMAL HIGH (ref 23–325)

## 2023-09-26 LAB — MYOSITIS SPECIFIC II ANTIBODIES PANEL
EJ AB: <11 SI (ref <11)
JO-1 AB: <11 SI (ref <11)
MDA-5 AB: <11 SI (ref <11)
MI-2 ALPHA AB: <11 SI (ref <11)
MI-2 BETA AB: <11 SI (ref <11)
NXP-2 AB: <11 SI (ref <11)
OJ AB: <11 SI (ref <11)
PL-12 AB: <11 SI (ref <11)
PL-7 AB: <11 SI (ref <11)
SRP-AB: <11 SI (ref <11)
TIF-1y AB: <11 SI (ref <11)

## 2023-09-26 LAB — MITOCHONDRIAL ANTIBODIES: Mitochondrial M2 Ab, IgG: <=20 U (ref <=20.0)

## 2023-09-26 LAB — ANTI-SMOOTH MUSCLE ANTIBODY, IGG: Actin (Smooth Muscle) Antibody (IGG): 20 U — ABNORMAL HIGH (ref <20)

## 2023-09-26 NOTE — Progress Notes (Signed)
 [address] [place] [address]             [phone]            Juan Ramos [id number] 01-06-1966   History of Present Illness:  [name] is a 58 year old man with medical history of hypertension, chronic migraine, history of traumatic brain injury, osteoarthritis, and BPH who presents for initial encounter for ascending thoracic aortic aneurysm. Aneurysm was found incidentally when he presented to the emergency department for a fall and chest pain. On CTA of chest from 08/01/2023 proximal descending thoracic aorta measured 4.3 cm in diameter.  He reports that he has been doing okay.  He was seen in the emergency department in July and has been having chest pain since.  The chest pain is not constant.  He also reports some palpitations.  He is currently being followed by rheumatology for an elevated CK level.  He was seen recently at their clinic and had further blood work drawn. His blood pressure is well-controlled with current medication therapy.  He does check his blood pressures at home and reports readings in the 120s-130s/80s. He denies shortness of breath and lower leg swelling.    Current Outpatient Medications on File Prior to Visit  Medication Sig Dispense Refill   amLODipine (NORVASC) 10 MG tablet Take 10 mg by mouth daily.     Aspirin -Calcium Carbonate 81-777 MG TABS Take by mouth.     atenolol (TENORMIN) 25 MG tablet Take by mouth.     diclofenac sodium (VOLTAREN) 1 % GEL Place onto the skin.     dicyclomine (BENTYL) 10 MG capsule Take by mouth.     DULoxetine (CYMBALTA) 30 MG capsule Take by mouth.     HYDROcodone -acetaminophen  (NORCO/VICODIN) 5-325 MG tablet Take 1 tablet by mouth every 6 (six) hours as needed for moderate pain (pain score 4-6). 8 tablet 0   lactulose  (CHRONULAC ) 10 GM/15ML solution Take 15 mLs (10 g total) by mouth in the morning, at noon, in the evening, and at bedtime. 1800 mL 11   loratadine (CLARITIN)  10 MG tablet Take by mouth.     losartan (COZAAR) 50 MG tablet Take 25 mg by mouth daily.     methocarbamol  (ROBAXIN ) 500 MG tablet Take 1 tablet (500 mg total) by mouth every 8 (eight) hours as needed. 15 tablet 0   naproxen  (NAPROSYN ) 500 MG tablet Take by mouth.     omeprazole  (PRILOSEC) 40 MG capsule Take 1 capsule (40 mg total) by mouth daily. 90 capsule 0   oxybutynin (DITROPAN-XL) 10 MG 24 hr tablet Take by mouth.     polyethylene glycol (MIRALAX / GLYCOLAX) packet Take by mouth. (Patient not taking: Reported on 09/20/2023)     sildenafil (VIAGRA) 100 MG tablet Take by mouth.     SUMAtriptan (IMITREX) 100 MG tablet Take by mouth.     SUMAtriptan 6 MG/0.5ML SOAJ Inject into the skin.     topiramate (TOPAMAX) 25 MG capsule Take by mouth.     venlafaxine (EFFEXOR) 75 MG tablet Take by mouth.     No current facility-administered medications on file prior to visit.     ROS: Review of Systems  Respiratory: Negative.  Negative for cough and shortness of breath.   Cardiovascular:  Positive for chest pain and palpitations. Negative for leg swelling.  Musculoskeletal:  Positive for joint pain and myalgias.  BP 134/89   Pulse 73   Resp 20   Ht 6' 6 (1.981 m)   Wt (!) 303 lb (137.4 kg)   SpO2 98% Comment: RA  BMI 35.02 kg/m   Physical Exam Constitutional:      Appearance: Normal appearance.  HENT:     Head: Normocephalic and atraumatic.  Cardiovascular:     Rate and Rhythm: Normal rate and regular rhythm.     Heart sounds: Normal heart sounds, S1 normal and S2 normal.  Pulmonary:     Effort: Pulmonary effort is normal.     Breath sounds: Normal breath sounds.  Skin:    General: Skin is warm and dry.  Neurological:     General: No focal deficit present.     Mental Status: He is alert and oriented to person, place, and time.      Imaging: Narrative & Impression  CLINICAL DATA:  Pulmonary embolism (PE) suspected, high prob Pleuritic right-sided chest pain, fall,  evaluate for PE, rib fracture; Abdominal trauma, blunt Right-sided abdominal pain after a fall   EXAM: CT ANGIOGRAPHY CHEST   CT ABDOMEN AND PELVIS WITH CONTRAST   TECHNIQUE: Multidetector CT imaging of the chest was performed using the standard protocol during bolus administration of intravenous contrast. Multiplanar CT image reconstructions and MIPs were obtained to evaluate the vascular anatomy. Multidetector CT imaging of the abdomen and pelvis was performed using the standard protocol during bolus administration of intravenous contrast.   RADIATION DOSE REDUCTION: This exam was performed according to the departmental dose-optimization program which includes automated exposure control, adjustment of the mA and/or kV according to patient size and/or use of iterative reconstruction technique.   CONTRAST:  OMNIPAQUE  IOHEXOL  350 MG/ML SOLN   COMPARISON:  04/01/2021   FINDINGS: CTA CHEST FINDINGS   Cardiovascular: Adequate opacification of the pulmonary arterial tree. No intraluminal filling defect identified to suggest acute pulmonary embolism. Central pulmonary arteries are of normal caliber. No significant coronary artery calcification. Cardiac size is within normal limits. No pericardial effusion. Mild atherosclerotic calcification within the thoracic aorta. There is progressive fusiform dilation of the proximal descending thoracic aorta which measures 4.3 cm in diameter (previously measuring 3.9 cm). Ascending and distal descending thoracic aorta are caliber.   Mediastinum/Nodes: No enlarged mediastinal, hilar, or axillary lymph nodes. Thyroid gland, trachea, and esophagus demonstrate no significant findings.   Lungs/Pleura: Mild bronchial wall thickening keeping with airway inflammation. No pneumothorax or pleural effusion. No confluent pulmonary infiltrate. No pneumothorax or pleural effusion.   Musculoskeletal: No acute bone abnormality. No lytic or blastic  bone lesion. Osseous structures are age appropriate.   Review of the MIP images confirms the above findings.   CT ABDOMEN and PELVIS FINDINGS   Hepatobiliary: Scattered cysts within the liver. No enhancing intrahepatic mass. No intra or extrahepatic biliary ductal dilation. Gallbladder unremarkable.   Pancreas: Unremarkable. No pancreatic ductal dilatation or surrounding inflammatory changes.   Spleen: Normal in size without focal abnormality.   Adrenals/Urinary Tract: Adrenal glands are unremarkable. Kidneys are normal, without renal calculi, focal lesion, or hydronephrosis. Bladder is unremarkable.   Stomach/Bowel: Stomach is within normal limits. Appendix appears normal. No evidence of bowel wall thickening, distention, or inflammatory changes.   Vascular/Lymphatic: No significant vascular findings are present. No enlarged abdominal or pelvic lymph nodes.   Reproductive: Prostate is unremarkable.   Other: Bilateral inguinal hernia repair with mesh. No recurrent hernia identified. No abdominopelvic ascites.   Musculoskeletal: No acute bone abnormality. No lytic or blastic  bone lesion. Osseous structures are age appropriate.   Review of the MIP images confirms the above findings.   IMPRESSION: 1. No evidence of pulmonary embolism. 2. Mild bronchial wall thickening in keeping with airway inflammation. 3. No acute intrathoracic or intra-abdominal pathology identified. 4. Progressive fusiform dilation of the proximal descending thoracic aorta which measures 4.3 cm in diameter (previously measuring 3.9 cm). Recommend semi-annual imaging followup by CTA or MRA and referral to cardiothoracic surgery if not already obtained. This recommendation follows 2010 ACCF/AHA/AATS/ACR/ASA/SCA/SCAI/SIR/STS/SVM Guidelines for the Diagnosis and Management of Patients With Thoracic Aortic Disease. Circulation. 2010; 121: [id number]. Aortic aneurysm NOS (ICD10-I71.9) 5. Aortic  atherosclerosis.   Aortic Atherosclerosis (ICD10-I70.0).     Electronically Signed   By: Dorethia Molt M.D.   On: 08/01/2023 01:51      A/P:  Dilation of descending aorta (HCC) -4.3 cm fusiform dilation of the proximal descending thoracic aorta on CTA of chest. We discussed the natural history and and risk factors for growth of aortic aneurysms. Discussed recommendations to minimize the risk of further expansion or dissection including careful blood pressure control, avoidance of contact sports and heavy lifting, attention to lipid management.  We covered the importance of staying never user of tobacco.  The patient does not yet meet surgical criteria of >5.5cm. The patient is aware of signs and symptoms of aortic dissection and when to present to the emergency department   -Follow up in one year with CTA of chest    Risk Modification:  Statin:  not currently prescribed  Smoking cessation instruction/counseling given: never user  Patient was counseled on importance of Blood Pressure Control  They are instructed to contact their Primary Care Physician if they start to have blood pressure readings over 130s/90s. Do not ever stop blood pressure medications on your own, unless instructed by healthcare professional.  Please avoid use of Fluoroquinolones as this can potentially increase your risk of Aortic Rupture and/or Dissection  Patient educated on signs and symptoms of Aortic Dissection, handout also provided in AVS   Atypical chest pain -Referral placed to cardiology for atypical chest pain -Continue with blood pressure medications as prescribed -Alarm symptoms discussed and patient states understanding of proper follow up     Juan CHRISTELLA Rough, PA-C 09/26/23

## 2023-09-26 NOTE — Patient Instructions (Signed)

## 2023-09-27 ENCOUNTER — Ambulatory Visit: Payer: Self-pay | Admitting: Internal Medicine

## 2023-09-27 NOTE — Progress Notes (Signed)
 CK improved partially to 591 which is reassuring. The repeat antibody testing was now negative for myositis antibodies. I don't think we need to start on any myositis drug at this time. I recommend we refer you for a new EMG study to better evaluate if there is ongoing muscle inflammation vs just nerve damage as the cause.

## 2023-09-30 DIAGNOSIS — Z419 Encounter for procedure for purposes other than remedying health state, unspecified: Secondary | ICD-10-CM | POA: Diagnosis not present

## 2023-10-05 DIAGNOSIS — R0789 Other chest pain: Secondary | ICD-10-CM | POA: Insufficient documentation

## 2023-10-19 DIAGNOSIS — S46911A Strain of unspecified muscle, fascia and tendon at shoulder and upper arm level, right arm, initial encounter: Secondary | ICD-10-CM

## 2023-10-19 HISTORY — DX: Strain of unspecified muscle, fascia and tendon at shoulder and upper arm level, right arm, initial encounter: S46.911A

## 2023-10-26 ENCOUNTER — Ambulatory Visit

## 2023-10-30 DIAGNOSIS — Z419 Encounter for procedure for purposes other than remedying health state, unspecified: Secondary | ICD-10-CM | POA: Diagnosis not present
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
## 2023-12-01 ENCOUNTER — Encounter: Payer: Self-pay | Admitting: Internal Medicine

## 2023-12-01 ENCOUNTER — Ambulatory Visit: Attending: Internal Medicine | Admitting: Internal Medicine

## 2023-12-01 VITALS — BP 139/91 | HR 90 | Temp 97.6°F | Resp 16 | Ht 78.0 in | Wt 301.0 lb

## 2023-12-01 DIAGNOSIS — M791 Myalgia, unspecified site: Secondary | ICD-10-CM | POA: Diagnosis not present

## 2023-12-01 DIAGNOSIS — E889 Metabolic disorder, unspecified: Secondary | ICD-10-CM

## 2023-12-01 DIAGNOSIS — R748 Abnormal levels of other serum enzymes: Secondary | ICD-10-CM

## 2023-12-01 DIAGNOSIS — R7689 Other specified abnormal immunological findings in serum: Secondary | ICD-10-CM | POA: Diagnosis not present

## 2023-12-06 LAB — CK: Total CK: 1035 U/L — ABNORMAL HIGH (ref 23–325)

## 2023-12-06 LAB — ALDOLASE: Aldolase: 7.1 U/L (ref <=8.1)

## 2023-12-06 LAB — IGG, IGA, IGM
IgG (Immunoglobin G), Serum: 1329 mg/dL (ref 600–1640)
IgM, Serum: 125 mg/dL (ref 50–300)
Immunoglobulin A: 414 mg/dL — ABNORMAL HIGH (ref 47–310)

## 2023-12-06 LAB — MTHFR DNA ANALYSIS: Methylenetetrahydrofolate Reductase (MTHFR),DNA: POSITIVE

## 2023-12-28 ENCOUNTER — Encounter: Payer: Self-pay | Admitting: Otolaryngology
